# Patient Record
Sex: Male | Born: 1950 | Race: White | Hispanic: No | Marital: Married | Smoking: Never smoker
Health system: Southern US, Community
[De-identification: ages and names within clinical notes are randomized; demographics above are authoritative.]

## PROBLEM LIST (undated history)

## (undated) DIAGNOSIS — E669 Obesity, unspecified: Secondary | ICD-10-CM

## (undated) DIAGNOSIS — M1990 Unspecified osteoarthritis, unspecified site: Secondary | ICD-10-CM

## (undated) DIAGNOSIS — Z87442 Personal history of urinary calculi: Secondary | ICD-10-CM

## (undated) DIAGNOSIS — K611 Rectal abscess: Secondary | ICD-10-CM

## (undated) DIAGNOSIS — G4733 Obstructive sleep apnea (adult) (pediatric): Secondary | ICD-10-CM

## (undated) HISTORY — PX: UMBILICAL HERNIA REPAIR: SUR1181

## (undated) HISTORY — PX: BACK SURGERY: SHX140

## (undated) HISTORY — PX: LIGAMENT REPAIR: SHX5444

## (undated) HISTORY — PX: INGUINAL HERNIA REPAIR: SUR1180

## (undated) HISTORY — PX: CATARACT EXTRACTION: SUR2

## (undated) HISTORY — PX: ANKLE SURGERY: SHX546

## (undated) HISTORY — PX: NASAL SINUS SURGERY: SHX719

## (undated) HISTORY — PX: LUMBAR FUSION: SHX111

## (undated) HISTORY — PX: BUNIONECTOMY: SHX129

## (undated) HISTORY — PX: HERNIA REPAIR: SHX51

## (undated) HISTORY — DX: Rectal abscess: K61.1

## (undated) HISTORY — DX: Obesity, unspecified: E66.9

## (undated) HISTORY — DX: Unspecified osteoarthritis, unspecified site: M19.90

## (undated) HISTORY — PX: CATARACT EXTRACTION W/ INTRAOCULAR LENS IMPLANT: SHX1309

## (undated) HISTORY — PX: COLONOSCOPY: SHX174

## (undated) HISTORY — DX: Obstructive sleep apnea (adult) (pediatric): G47.33

---

## 1998-01-01 ENCOUNTER — Ambulatory Visit (HOSPITAL_BASED_OUTPATIENT_CLINIC_OR_DEPARTMENT_OTHER): Admission: RE | Admit: 1998-01-01 | Discharge: 1998-01-01 | Payer: Self-pay | Admitting: General Surgery

## 2001-01-28 ENCOUNTER — Ambulatory Visit (HOSPITAL_COMMUNITY): Admission: RE | Admit: 2001-01-28 | Discharge: 2001-01-28 | Payer: Self-pay | Admitting: Gastroenterology

## 2001-03-18 ENCOUNTER — Encounter: Admission: RE | Admit: 2001-03-18 | Discharge: 2001-03-18 | Payer: Self-pay | Admitting: General Surgery

## 2001-03-18 ENCOUNTER — Encounter: Payer: Self-pay | Admitting: General Surgery

## 2001-03-19 ENCOUNTER — Ambulatory Visit (HOSPITAL_BASED_OUTPATIENT_CLINIC_OR_DEPARTMENT_OTHER): Admission: RE | Admit: 2001-03-19 | Discharge: 2001-03-19 | Payer: Self-pay | Admitting: General Surgery

## 2001-08-05 ENCOUNTER — Encounter: Payer: Self-pay | Admitting: Neurosurgery

## 2001-08-05 ENCOUNTER — Inpatient Hospital Stay (HOSPITAL_COMMUNITY): Admission: RE | Admit: 2001-08-05 | Discharge: 2001-08-07 | Payer: Self-pay | Admitting: Neurosurgery

## 2001-08-27 ENCOUNTER — Emergency Department (HOSPITAL_COMMUNITY): Admission: EM | Admit: 2001-08-27 | Discharge: 2001-08-28 | Payer: Self-pay | Admitting: Emergency Medicine

## 2008-08-12 ENCOUNTER — Encounter: Payer: Self-pay | Admitting: Cardiology

## 2011-04-07 ENCOUNTER — Encounter (HOSPITAL_COMMUNITY): Payer: Self-pay | Admitting: Emergency Medicine

## 2011-04-07 ENCOUNTER — Emergency Department (HOSPITAL_COMMUNITY)
Admission: EM | Admit: 2011-04-07 | Discharge: 2011-04-07 | Discharge status: Against Medical Advice | Payer: BC Managed Care – PPO | Attending: Emergency Medicine | Admitting: Emergency Medicine

## 2011-04-07 DIAGNOSIS — R509 Fever, unspecified: Secondary | ICD-10-CM | POA: Insufficient documentation

## 2011-04-07 NOTE — ED Notes (Signed)
PT. REPORTS PERSISTENT FEVER ONSET LAST WEEK , STATES RECENT TRAVEL TO [place] SEEN AT [facility] IN CLINIC LAST Saturday , BLOOD WORK DONE / URINALYSIS WITH HEMATURIA.

## 2011-04-08 ENCOUNTER — Encounter (HOSPITAL_COMMUNITY): Payer: Self-pay | Admitting: Anesthesiology

## 2011-04-08 ENCOUNTER — Encounter (HOSPITAL_COMMUNITY): Payer: Self-pay | Admitting: Emergency Medicine

## 2011-04-08 ENCOUNTER — Encounter (HOSPITAL_COMMUNITY)
Admission: EM | Disposition: A | Discharge status: Home / Self Care | Payer: Self-pay | Source: Home / Self Care | Attending: Emergency Medicine

## 2011-04-08 ENCOUNTER — Observation Stay (HOSPITAL_COMMUNITY)
Admission: EM | Admit: 2011-04-08 | Discharge: 2011-04-09 | Disposition: A | Discharge status: Home / Self Care | Payer: BC Managed Care – PPO | Attending: General Surgery | Admitting: General Surgery

## 2011-04-08 ENCOUNTER — Emergency Department (HOSPITAL_COMMUNITY): Payer: BC Managed Care – PPO | Admitting: Anesthesiology

## 2011-04-08 ENCOUNTER — Emergency Department (HOSPITAL_COMMUNITY): Payer: BC Managed Care – PPO

## 2011-04-08 DIAGNOSIS — R51 Headache: Secondary | ICD-10-CM | POA: Insufficient documentation

## 2011-04-08 DIAGNOSIS — K612 Anorectal abscess: Principal | ICD-10-CM | POA: Insufficient documentation

## 2011-04-08 DIAGNOSIS — K611 Rectal abscess: Secondary | ICD-10-CM

## 2011-04-08 DIAGNOSIS — R5381 Other malaise: Secondary | ICD-10-CM | POA: Insufficient documentation

## 2011-04-08 HISTORY — PX: INCISION AND DRAINAGE PERIRECTAL ABSCESS: SHX1804

## 2011-04-08 LAB — COMPREHENSIVE METABOLIC PANEL
ALT: 43 U/L (ref 0–53)
AST: 28 U/L (ref 0–37)
Calcium: 9.7 mg/dL (ref 8.4–10.5)
Creatinine, Ser: 1.1 mg/dL (ref 0.50–1.35)
GFR calc Af Amer: 82 mL/min — ABNORMAL LOW (ref >90)
GFR calc non Af Amer: 71 mL/min — ABNORMAL LOW (ref >90)
Sodium: 135 mEq/L (ref 135–145)
Total Protein: 7.2 g/dL (ref 6.0–8.3)

## 2011-04-08 LAB — URINE MICROSCOPIC-ADD ON

## 2011-04-08 LAB — CBC
HCT: 39.4 % (ref 39.0–52.0)
MCH: 28.7 pg (ref 26.0–34.0)
MCHC: 33.8 g/dL (ref 30.0–36.0)
MCV: 84.9 fL (ref 78.0–100.0)
Platelets: 232 10*3/uL (ref 150–400)
RDW: 13.2 % (ref 11.5–15.5)

## 2011-04-08 LAB — URINALYSIS, ROUTINE W REFLEX MICROSCOPIC
Nitrite: NEGATIVE
Specific Gravity, Urine: 1.029 (ref 1.005–1.030)
Urobilinogen, UA: 4 mg/dL — ABNORMAL HIGH (ref 0.0–1.0)
pH: 6 (ref 5.0–8.0)

## 2011-04-08 LAB — DIFFERENTIAL
Basophils Absolute: 0 10*3/uL (ref 0.0–0.1)
Eosinophils Absolute: 0 10*3/uL (ref 0.0–0.7)
Eosinophils Relative: 0 % (ref 0–5)
Monocytes Absolute: 1.8 10*3/uL — ABNORMAL HIGH (ref 0.1–1.0)

## 2011-04-08 SURGERY — INCISION AND DRAINAGE, ABSCESS, PERIRECTAL
Anesthesia: General | Site: Rectum | Wound class: Contaminated

## 2011-04-08 MED ORDER — SUCCINYLCHOLINE CHLORIDE 20 MG/ML IJ SOLN
INTRAMUSCULAR | Status: DC | PRN
  Administered 2011-04-08: 120 mg via INTRAVENOUS

## 2011-04-08 MED ORDER — PROPOFOL 10 MG/ML IV BOLUS
INTRAVENOUS | Status: DC | PRN
  Administered 2011-04-08: 200 mg via INTRAVENOUS
  Administered 2011-04-08: 50 mg via INTRAVENOUS

## 2011-04-08 MED ORDER — FENTANYL CITRATE 0.05 MG/ML IJ SOLN
INTRAMUSCULAR | Status: DC | PRN
  Administered 2011-04-08: 50 ug via INTRAVENOUS
  Administered 2011-04-08: 100 ug via INTRAVENOUS
  Administered 2011-04-08: 50 ug via INTRAVENOUS

## 2011-04-08 MED ORDER — SODIUM CHLORIDE 0.9 % IV BOLUS (SEPSIS)
500.0000 mL | Freq: Once | INTRAVENOUS | Status: AC
  Administered 2011-04-08: 500 mL via INTRAVENOUS

## 2011-04-08 MED ORDER — ERTAPENEM SODIUM 1 G IJ SOLR
1.0000 g | INTRAMUSCULAR | Status: DC | PRN
  Administered 2011-04-08: 1 g via INTRAVENOUS

## 2011-04-08 MED ORDER — SODIUM CHLORIDE 0.9 % IV SOLN
1.0000 g | Freq: Once | INTRAVENOUS | Status: DC
  Filled 2011-04-08 (×2): qty 1

## 2011-04-08 MED ORDER — MORPHINE SULFATE 4 MG/ML IJ SOLN
4.0000 mg | Freq: Once | INTRAMUSCULAR | Status: AC
  Administered 2011-04-08: 4 mg via INTRAVENOUS
  Filled 2011-04-08: qty 1

## 2011-04-08 MED ORDER — AMOXICILLIN-POT CLAVULANATE 875-125 MG PO TABS
1.0000 | ORAL_TABLET | Freq: Two times a day (BID) | ORAL | Status: DC
  Filled 2011-04-08 (×2): qty 1

## 2011-04-08 MED ORDER — HYDROMORPHONE HCL PF 1 MG/ML IJ SOLN
INTRAMUSCULAR | Status: AC
  Filled 2011-04-08: qty 1

## 2011-04-08 MED ORDER — KETOROLAC TROMETHAMINE 15 MG/ML IJ SOLN
15.0000 mg | Freq: Four times a day (QID) | INTRAMUSCULAR | Status: DC | PRN
  Administered 2011-04-08: 15 mg via INTRAVENOUS

## 2011-04-08 MED ORDER — METOCLOPRAMIDE HCL 5 MG/ML IJ SOLN
INTRAMUSCULAR | Status: DC | PRN
  Administered 2011-04-08: 10 mg via INTRAVENOUS

## 2011-04-08 MED ORDER — METOCLOPRAMIDE HCL 5 MG/ML IJ SOLN: 10.0000 mg | Freq: Once | INTRAMUSCULAR | Status: AC | PRN

## 2011-04-08 MED ORDER — HYDROMORPHONE HCL PF 1 MG/ML IJ SOLN
0.2500 mg | INTRAMUSCULAR | Status: DC | PRN
  Administered 2011-04-08 (×2): 0.5 mg via INTRAVENOUS

## 2011-04-08 MED ORDER — MIDAZOLAM HCL 5 MG/5ML IJ SOLN
INTRAMUSCULAR | Status: DC | PRN
  Administered 2011-04-08: 2 mg via INTRAVENOUS

## 2011-04-08 MED ORDER — DROPERIDOL 2.5 MG/ML IJ SOLN
INTRAMUSCULAR | Status: DC | PRN
  Administered 2011-04-08: 0.625 mg via INTRAVENOUS

## 2011-04-08 MED ORDER — KCL IN DEXTROSE-NACL 20-5-0.45 MEQ/L-%-% IV SOLN
INTRAVENOUS | Status: DC
  Administered 2011-04-08: via INTRAVENOUS
  Filled 2011-04-08 (×3): qty 1000

## 2011-04-08 MED ORDER — ONDANSETRON HCL 4 MG/2ML IJ SOLN
INTRAMUSCULAR | Status: DC | PRN
  Administered 2011-04-08: 4 mg via INTRAVENOUS

## 2011-04-08 MED ORDER — LACTATED RINGERS IV SOLN
INTRAVENOUS | Status: DC | PRN
  Administered 2011-04-08: 22:00:00 via INTRAVENOUS

## 2011-04-08 MED ORDER — KETOROLAC TROMETHAMINE 30 MG/ML IJ SOLN
INTRAMUSCULAR | Status: AC
  Administered 2011-04-08: 15 mg via INTRAVENOUS
  Filled 2011-04-08: qty 1

## 2011-04-08 MED ORDER — KCL IN DEXTROSE-NACL 20-5-0.45 MEQ/L-%-% IV SOLN
INTRAVENOUS | Status: AC
  Filled 2011-04-08: qty 1000

## 2011-04-08 MED ORDER — IOHEXOL 300 MG/ML  SOLN
100.0000 mL | Freq: Once | INTRAMUSCULAR | Status: AC | PRN
  Administered 2011-04-08: 100 mL via INTRAVENOUS

## 2011-04-08 MED ORDER — ONDANSETRON HCL 4 MG/2ML IJ SOLN
4.0000 mg | Freq: Once | INTRAMUSCULAR | Status: AC
  Administered 2011-04-08: 4 mg via INTRAVENOUS
  Filled 2011-04-08: qty 2

## 2011-04-08 SURGICAL SUPPLY — 31 items
BLADE SURG 15 STRL LF DISP TIS (BLADE) ×1 IMPLANT
BLADE SURG 15 STRL SS (BLADE) ×2
CANISTER SUCTION 2500CC (MISCELLANEOUS) ×2 IMPLANT
CLEANER TIP ELECTROSURG 2X2 (MISCELLANEOUS) ×1 IMPLANT
CLOTH BEACON ORANGE TIMEOUT ST (SAFETY) ×1 IMPLANT
COVER SURGICAL LIGHT HANDLE (MISCELLANEOUS) ×2 IMPLANT
DRAPE UTILITY 15X26 W/TAPE STR (DRAPE) ×4 IMPLANT
DRSG PAD ABDOMINAL 8X10 ST (GAUZE/BANDAGES/DRESSINGS) ×2 IMPLANT
ELECT REM PT RETURN 9FT ADLT (ELECTROSURGICAL) ×2
ELECTRODE REM PT RTRN 9FT ADLT (ELECTROSURGICAL) IMPLANT
GAUZE PACKING IODOFORM 1 (PACKING) IMPLANT
GAUZE SPONGE 4X4 16PLY XRAY LF (GAUZE/BANDAGES/DRESSINGS) ×2 IMPLANT
GLOVE BIO SURGEON STRL SZ8 (GLOVE) ×2 IMPLANT
GLOVE BIOGEL PI IND STRL 8 (GLOVE) ×1 IMPLANT
GLOVE BIOGEL PI INDICATOR 8 (GLOVE) ×1
GOWN STRL NON-REIN LRG LVL3 (GOWN DISPOSABLE) ×4 IMPLANT
KIT BASIN OR (CUSTOM PROCEDURE TRAY) ×2 IMPLANT
KIT ROOM TURNOVER OR (KITS) ×2 IMPLANT
NS IRRIG 1000ML POUR BTL (IV SOLUTION) ×2 IMPLANT
PACK LITHOTOMY IV (CUSTOM PROCEDURE TRAY) ×2 IMPLANT
PAD ARMBOARD 7.5X6 YLW CONV (MISCELLANEOUS) ×4 IMPLANT
PENCIL BUTTON HOLSTER BLD 10FT (ELECTRODE) ×1 IMPLANT
SPONGE GAUZE 4X4 12PLY (GAUZE/BANDAGES/DRESSINGS) ×2 IMPLANT
SWAB COLLECTION DEVICE MRSA (MISCELLANEOUS) ×2 IMPLANT
TOWEL OR 17X24 6PK STRL BLUE (TOWEL DISPOSABLE) ×2 IMPLANT
TOWEL OR 17X26 10 PK STRL BLUE (TOWEL DISPOSABLE) ×2 IMPLANT
TUBE ANAEROBIC SPECIMEN COL (MISCELLANEOUS) ×2 IMPLANT
TUBE CONNECTING 12X1/4 (SUCTIONS) ×2 IMPLANT
UNDERPAD 30X30 INCONTINENT (UNDERPADS AND DIAPERS) ×2 IMPLANT
WATER STERILE IRR 1000ML POUR (IV SOLUTION) ×2 IMPLANT
YANKAUER SUCT BULB TIP NO VENT (SUCTIONS) ×2 IMPLANT

## 2011-04-08 NOTE — Transfer of Care (Signed)
Immediate Anesthesia Transfer of Care Note  Patient: Eric Cochran  Procedure(s) Performed: Procedure(s) (LRB): IRRIGATION AND DEBRIDEMENT PERIRECTAL ABSCESS (N/A)  Patient Location: PACU  Anesthesia Type: General  Level of Consciousness: awake  Airway & Oxygen Therapy: Patient connected to nasal cannula oxygen  Post-op Assessment: Report given to PACU RN, Post -op Vital signs reviewed and stable and Patient moving all extremities X 4  Post vital signs: Reviewed and stable  Complications: No apparent anesthesia complications

## 2011-04-08 NOTE — Op Note (Signed)
Preoperative diagnosis: Left perirectal abscess  Postoperative diagnosis: Left perirectal abscess  Procedure: Incision and drainage of perirectal abscess  Surgeon: Harriette Bouillon M.D.  Anesthesia: Gen. endotracheal anesthesia  EBL: Less than 50 cc  IV fluids: 1 L crystalloid  Drains none  Indications for procedure: The patient presents with a perirectal abscess diagnosed by physical exam and CT scanning. We discussed options of treatment. I recommended incision and drainage in the operating room given its large size. Risk of bleeding, infection, recurrence, the need for further surgery, incontinence, fistula formation, and worsening of underlying medical condition discussed. Medical management would be antibiotics alone. He wished to proceed with surgical drainage.  Description of procedure: The patient was seen in the holding area in. He is taken back to the operative. He was placed supine on the OR table. After induction of anesthesia, he was placed lithotomy. The anal canal was prepped and draped in sterile fashion and timeout was done. 1 g of Invanz was used. There is purulent drainage from the left anal canal at about 4:00. Scalpel was used after digital examination was done to open this up further and copious amounts of pus were expressed. There is a large cavity in the ischiorectal space on the left. Cavity was irrigated and packed with one-inch iodoform packing. Dry dressings were applied. Digital exam of the anal canal was normal except for some mild internal hemorrhoid disease. The patient was taken to lithotomy and extubated taken to PACU in satisfactory condition. Cultures were sent of the abscess. All final counts were correct.

## 2011-04-08 NOTE — ED Notes (Signed)
Patient remains off floor at present

## 2011-04-08 NOTE — Anesthesia Postprocedure Evaluation (Signed)
Anesthesia Post Note  Patient: Eric Cochran  Procedure(s) Performed: Procedure(s) (LRB): IRRIGATION AND DEBRIDEMENT PERIRECTAL ABSCESS (N/A)  Anesthesia type: general  Patient location: PACU  Post pain: Pain level controlled  Post assessment: Patient's Cardiovascular Status Stable  Last Vitals:  Filed Vitals:   04/08/11 2245  BP: 118/71  Pulse: 108  Temp: 37.4 C  Resp: 18    Post vital signs: Reviewed and stable  Level of consciousness: sedated  Complications: No apparent anesthesia complications

## 2011-04-08 NOTE — ED Notes (Signed)
LAB at bedside for blood draw.

## 2011-04-08 NOTE — Anesthesia Procedure Notes (Signed)
Procedure Name: Intubation Date/Time: 04/08/2011 10:23 PM Performed by: Molli Hazard Pre-anesthesia Checklist: Patient identified, Emergency Drugs available, Suction available and Patient being monitored Patient Re-evaluated:Patient Re-evaluated prior to inductionOxygen Delivery Method: Circle system utilized Preoxygenation: Pre-oxygenation with 100% oxygen Intubation Type: IV induction, Rapid sequence and Cricoid Pressure applied Laryngoscope Size: Miller and 3 Grade View: Grade I Tube type: Oral Tube size: 7.5 mm Number of attempts: 2 (1st attempt w/ miller 2 - too short. Easy intub w/ Mill 3) Airway Equipment and Method: Stylet Placement Confirmation: ETT inserted through vocal cords under direct vision,  positive ETCO2 and breath sounds checked- equal and bilateral Secured at: 23 cm Tube secured with: Tape Dental Injury: Teeth and Oropharynx as per pre-operative assessment

## 2011-04-08 NOTE — H&P (Signed)
Eric Cochran is an 61 y.o. male.   Chief Complaint: Perianal pain HPI: 5 day history of perianal pain.  Severe.  Difficult to sit.  No fever or chills.  Rectal drainage noted.  Pain Is sharp 10/10 in anal canal made worse with sitting.  History reviewed. No pertinent past medical history.  Past Surgical History  Procedure Date  . Back surgery   . Ankle surgery   . Nasal sinus surgery     No family history on file. Social History:  reports that he has never smoked. He does not have any smokeless tobacco history on file. He reports that he does not drink alcohol or use illicit drugs.  Allergies: No Known Allergies  Medications Prior to Admission  Medication Dose Route Frequency Provider Last Rate Last Dose  . iohexol (OMNIPAQUE) 300 MG/ML solution 100 mL  100 mL Intravenous Once PRN Medication Radiologist, MD   100 mL at 04/08/11 2007  . morphine 4 MG/ML injection 4 mg  4 mg Intravenous Once Loren Racer, MD   4 mg at 04/08/11 1758  . morphine 4 MG/ML injection 4 mg  4 mg Intravenous Once Loren Racer, MD   4 mg at 04/08/11 1921  . ondansetron (ZOFRAN) injection 4 mg  4 mg Intravenous Once Loren Racer, MD   4 mg at 04/08/11 1756  . sodium chloride 0.9 % bolus 500 mL  500 mL Intravenous Once Loren Racer, MD   500 mL at 04/08/11 1756  . sodium chloride 0.9 % bolus 500 mL  500 mL Intravenous Once Loren Racer, MD   500 mL at 04/08/11 2027   No current outpatient prescriptions on file as of 04/08/2011.    Results for orders placed during the hospital encounter of 04/08/11 (from the past 48 hour(s))  CBC     Status: Abnormal   Collection Time   04/08/11  5:36 PM      Component Value Range Comment   WBC 17.8 (*) 4.0 - 10.5 (K/uL)    RBC 4.64  4.22 - 5.81 (MIL/uL)    Hemoglobin 13.3  13.0 - 17.0 (g/dL)    HCT 16.1  09.6 - 04.5 (%)    MCV 84.9  78.0 - 100.0 (fL)    MCH 28.7  26.0 - 34.0 (pg)    MCHC 33.8  30.0 - 36.0 (g/dL)    RDW 40.9  81.1 - 91.4 (%)    Platelets  232  150 - 400 (K/uL)   DIFFERENTIAL     Status: Abnormal   Collection Time   04/08/11  5:36 PM      Component Value Range Comment   Neutrophils Relative 85 (*) 43 - 77 (%)    Neutro Abs 15.2 (*) 1.7 - 7.7 (K/uL)    Lymphocytes Relative 4 (*) 12 - 46 (%)    Lymphs Abs 0.8  0.7 - 4.0 (K/uL)    Monocytes Relative 10  3 - 12 (%)    Monocytes Absolute 1.8 (*) 0.1 - 1.0 (K/uL)    Eosinophils Relative 0  0 - 5 (%)    Eosinophils Absolute 0.0  0.0 - 0.7 (K/uL)    Basophils Relative 0  0 - 1 (%)    Basophils Absolute 0.0  0.0 - 0.1 (K/uL)   COMPREHENSIVE METABOLIC PANEL     Status: Abnormal   Collection Time   04/08/11  5:36 PM      Component Value Range Comment   Sodium 135  135 - 145 (mEq/L)  Potassium 3.6  3.5 - 5.1 (mEq/L)    Chloride 101  96 - 112 (mEq/L)    CO2 23  19 - 32 (mEq/L)    Glucose, Bld 156 (*) 70 - 99 (mg/dL)    BUN 17  6 - 23 (mg/dL)    Creatinine, Ser 1.61  0.50 - 1.35 (mg/dL)    Calcium 9.7  8.4 - 10.5 (mg/dL)    Total Protein 7.2  6.0 - 8.3 (g/dL)    Albumin 3.0 (*) 3.5 - 5.2 (g/dL)    AST 28  0 - 37 (U/L)    ALT 43  0 - 53 (U/L)    Alkaline Phosphatase 155 (*) 39 - 117 (U/L)    Total Bilirubin 0.6  0.3 - 1.2 (mg/dL)    GFR calc non Af Amer 71 (*) >90 (mL/min)    GFR calc Af Amer 82 (*) >90 (mL/min)   URINALYSIS, ROUTINE W REFLEX MICROSCOPIC     Status: Abnormal   Collection Time   04/08/11  5:56 PM      Component Value Range Comment   Color, Urine AMBER (*) YELLOW  BIOCHEMICALS MAY BE AFFECTED BY COLOR   APPearance CLEAR  CLEAR     Specific Gravity, Urine 1.029  1.005 - 1.030     pH 6.0  5.0 - 8.0     Glucose, UA NEGATIVE  NEGATIVE (mg/dL)    Hgb urine dipstick MODERATE (*) NEGATIVE     Bilirubin Urine SMALL (*) NEGATIVE     Ketones, ur 15 (*) NEGATIVE (mg/dL)    Protein, ur 096 (*) NEGATIVE (mg/dL)    Urobilinogen, UA 4.0 (*) 0.0 - 1.0 (mg/dL)    Nitrite NEGATIVE  NEGATIVE     Leukocytes, UA TRACE (*) NEGATIVE    URINE MICROSCOPIC-ADD ON     Status:  Abnormal   Collection Time   04/08/11  5:56 PM      Component Value Range Comment   Squamous Epithelial / LPF FEW (*) RARE     WBC, UA 0-2  <3 (WBC/hpf)    RBC / HPF 7-10  <3 (RBC/hpf)    Bacteria, UA RARE  RARE     Casts GRANULAR CAST (*) NEGATIVE     Urine-Other MUCOUS PRESENT      Ct Abdomen Pelvis W Contrast  04/08/2011  *RADIOLOGY REPORT*  Clinical Data: Perirectal pain.  CT ABDOMEN AND PELVIS WITH CONTRAST  Technique:  Multidetector CT imaging of the abdomen and pelvis was performed following the standard protocol during bolus administration of intravenous contrast.  Contrast: OMNIPAQUE IOHEXOL 300 MG/ML IJ SOLN  Comparison: No priors.  Findings:  Lung Bases: Dependent atelectasis in the lower lobes of the lungs bilaterally.  Otherwise, unremarkable.  Abdomen/Pelvis:  The enhanced appearance of the liver, gallbladder, pancreas, spleen and bilateral adrenal glands is unremarkable.  In the right renal hilum there is a calcification that appears to be vascular (less likely to represent a small nonobstructive calculus). A 1.6 x 0.9 cm low attenuation lesion extends exophytically off the posterior aspect of the interpolar region of the right kidney, likely representing small cyst.  Other tiny sub- centimeter low attenuation lesions are noted in the left kidney, too small to characterize.  There is no ascites or pneumoperitoneum and no pathologic distension of bowel.  There are a few colonic diverticula, without surrounding inflammatory changes to suggest acute diverticulitis at this time.  Normal appendix.  Prostate and urinary bladder are unremarkable in appearance.  There is  extensive soft tissue thickening around the distal rectum and anus.  Importantly, surrounding the anus there appears to be a complex low attenuation fluid collection with some rim enhancement and internal locules of gas, concerning for perianal abscess.  This extends from approximately 3 o'clock counterclockwise around the  penis to approximately 7 o'clock on axial images.  Musculoskeletal: There are no aggressive appearing lytic or blastic lesions noted in the visualized portions of the skeleton.  Status post PLIF fat L3-L4.  IMPRESSION: 1.  Findings, as above, consistent with a large perianal abscess. 2.  Normal appendix. 3.  Status post PLIF at L3-L4.  Before a 1.6 x 0.9 cm low attenuation lesion in right kidney likely represents a small cyst. Other smaller sub-centimeter low attenuation lesions in left kidney are too small to definitively characterize.  These results were called by telephone on 04/08/2011  at  08:32 p.m. to  Dr. Ranae Palms, who verbally acknowledged these results.  Original Report Authenticated By: Florencia Reasons, M.D.    Review of Systems  Constitutional: Negative for fever and chills.  HENT: Negative.   Eyes: Negative.   Respiratory: Negative.   Cardiovascular: Negative.   Gastrointestinal: Negative.   Genitourinary: Negative.   Musculoskeletal: Negative.   Skin: Negative for itching and rash.  Neurological: Negative.   Endo/Heme/Allergies: Negative.   Psychiatric/Behavioral: Negative.     Blood pressure 109/67, pulse 101, temperature 99.4 F (37.4 C), temperature source Oral, resp. rate 16, SpO2 100.00%. Physical Exam  Constitutional: He is oriented to person, place, and time. He appears well-developed and well-nourished.  HENT:  Head: Normocephalic and atraumatic.  Eyes: EOM are normal. Pupils are equal, round, and reactive to light.  Neck: Normal range of motion. Neck supple.  Cardiovascular: Normal rate and regular rhythm.   Respiratory: Effort normal and breath sounds normal.  GI: Soft. Bowel sounds are normal.  Genitourinary:     Musculoskeletal: Normal range of motion.  Neurological: He is alert and oriented to person, place, and time. GCS eye subscore is 4. GCS verbal subscore is 5. GCS motor subscore is 6.  Skin: Skin is warm, dry and intact.  Psychiatric: He has a  normal mood and affect. His speech is normal and behavior is normal.     Assessment/Plan Perinal/perirectal abscess OR for incision and drainage.The procedure has been discussed with the patient.  Alternative therapies have been discussed with the patient.  Operative risks include bleeding,  Infection,  Organ injury,  Nerve injury,  Blood vessel injury,  DVT,  Pulmonary embolism,  Death,  And possible reoperation.  Medical management risks include worsening of present situation.  The success of the procedure is 50 -90 % at treating patients symptoms.  The patient understands and agrees to proceed.  Terrance Lanahan A. 04/08/2011, 9:15 PM

## 2011-04-08 NOTE — ED Provider Notes (Signed)
History     CSN: [id number]  Arrival date & time 04/08/11  1545   First MD Initiated Contact with Patient 04/08/11 1700      Chief Complaint  Patient presents with  . Fever    (Consider location/radiation/quality/duration/timing/severity/associated sxs/prior treatment) Patient is a 61 y.o. male presenting with fever. The history is provided by the patient.  Fever Primary symptoms of the febrile illness include fever, fatigue and headaches. Primary symptoms do not include cough, wheezing, shortness of breath, abdominal pain, nausea, vomiting, diarrhea, dysuria, myalgias, arthralgias or rash. The current episode started 6 to 7 days ago.  The fever began 6 to 7 days ago. The maximum temperature recorded prior to his arrival was 102 to 102.9 F.  The headache is not associated with eye pain, neck stiffness or weakness.   Pt was in [place] last week and upon returning has had 5-6 days of episodic fever, fatigue, mild L frontal HA, and rectal pain. He has been seen by MD x 2 and today had blood work done indicating elevated WBC. Referred to ED for eval. Pt states he has been having perirectal pain for about the same amount of time as the fever with discreet mass present. Denies N/V/D. No abd pain. No neck pain or stiffness, No focal weakness or visual changes. No sinus tenderness. HA is worse when fever is elevated. Now only having mild pain. Pt took no malarial prophylaxis.  History reviewed. No pertinent past medical history.  Past Surgical History  Procedure Date  . Back surgery   . Ankle surgery   . Nasal sinus surgery     No family history on file.  History  Substance Use Topics  . Smoking status: Never Smoker   . Smokeless tobacco: Not on file  . Alcohol Use: No      Review of Systems  Constitutional: Positive for fever, chills and fatigue.  HENT: Negative for ear pain, sore throat, facial swelling, neck pain, neck stiffness and sinus pressure.   Eyes: Negative for pain  and visual disturbance.  Respiratory: Negative for cough, chest tightness, shortness of breath and wheezing.   Cardiovascular: Negative for chest pain.  Gastrointestinal: Positive for rectal pain. Negative for nausea, vomiting, abdominal pain, diarrhea, blood in stool and anal bleeding.  Genitourinary: Negative for dysuria.  Musculoskeletal: Negative for myalgias and arthralgias.  Skin: Negative for rash.  Neurological: Positive for headaches. Negative for dizziness, seizures, weakness and numbness.    Allergies  Review of patient's allergies indicates no known allergies.  Home Medications   Current Outpatient Rx  Name Route Sig Dispense Refill  . NAPROXEN SODIUM 220 MG PO TABS Oral Take 440 mg by mouth daily as needed. For fever    . AMOXICILLIN-POT CLAVULANATE 875-125 MG PO TABS Oral Take 1 tablet by mouth every 12 (twelve) hours. 14 tablet 0  . OXYCODONE-ACETAMINOPHEN 5-325 MG PO TABS Oral Take 1-2 tablets by mouth every 4 (four) hours as needed. 40 tablet 0  . ZOLPIDEM TARTRATE 10 MG PO TABS Oral Take 10 mg by mouth at bedtime as needed. For sleep      BP 119/77  Pulse 93  Temp(Src) 98.3 F (36.8 C) (Oral)  Resp 18  Ht 5\' 8"  (1.727 m)  Wt 218 lb 11.1 oz (99.2 kg)  BMI 33.25 kg/m2  SpO2 97%  Physical Exam  Nursing note and vitals reviewed. Constitutional: He is oriented to person, place, and time. He appears well-developed and well-nourished. No distress.  HENT:  Head: Normocephalic and atraumatic.  Mouth/Throat: Oropharynx is clear and moist.       No sinus tenderness  Eyes: EOM are normal. Pupils are equal, round, and reactive to light.  Neck: Normal range of motion. Neck supple.       No meningismus   Cardiovascular: Normal rate and regular rhythm.   Pulmonary/Chest: Effort normal and breath sounds normal. No respiratory distress. He has no wheezes. He has no rales.  Abdominal: Soft. Bowel sounds are normal. He exhibits no mass. There is no tenderness. There is no  rebound and no guarding.  Genitourinary:       Pt has palpable, painful mass at upper right rectum. Prostate non-tender. No obvious hemorrhoids present.   Musculoskeletal: Normal range of motion. He exhibits no edema and no tenderness.  Lymphadenopathy:    He has no cervical adenopathy.  Neurological: He is alert and oriented to person, place, and time. No cranial nerve deficit.       5/5 motor, Sensation intact, finger to nose intact.   Skin: Skin is warm and dry. No rash noted. No erythema.  Psychiatric: He has a normal mood and affect. His behavior is normal.    ED Course  Procedures (including critical care time)  Labs Reviewed  CBC - Abnormal; Notable for the following:    WBC 17.8 (*)    All other components within normal limits  DIFFERENTIAL - Abnormal; Notable for the following:    Neutrophils Relative 85 (*)    Neutro Abs 15.2 (*)    Lymphocytes Relative 4 (*)    Monocytes Absolute 1.8 (*)    All other components within normal limits  COMPREHENSIVE METABOLIC PANEL - Abnormal; Notable for the following:    Glucose, Bld 156 (*)    Albumin 3.0 (*)    Alkaline Phosphatase 155 (*)    GFR calc non Af Amer 71 (*)    GFR calc Af Amer 82 (*)    All other components within normal limits  URINALYSIS, ROUTINE W REFLEX MICROSCOPIC - Abnormal; Notable for the following:    Color, Urine AMBER (*) BIOCHEMICALS MAY BE AFFECTED BY COLOR   Hgb urine dipstick MODERATE (*)    Bilirubin Urine SMALL (*)    Ketones, ur 15 (*)    Protein, ur 100 (*)    Urobilinogen, UA 4.0 (*)    Leukocytes, UA TRACE (*)    All other components within normal limits  URINE MICROSCOPIC-ADD ON - Abnormal; Notable for the following:    Squamous Epithelial / LPF FEW (*)    Casts GRANULAR CAST (*)    All other components within normal limits  BASIC METABOLIC PANEL - Abnormal; Notable for the following:    Potassium 3.3 (*)    Glucose, Bld 122 (*)    GFR calc non Af Amer 74 (*)    GFR calc Af Amer 86 (*)      All other components within normal limits  CBC - Abnormal; Notable for the following:    WBC 18.1 (*)    RBC 4.16 (*)    Hemoglobin 12.3 (*)    HCT 35.1 (*)    All other components within normal limits  CULTURE, ROUTINE-ABSCESS  ANAEROBIC CULTURE  AFB CULTURE WITH SMEAR  CULTURE, BLOOD (ROUTINE X 2)  CULTURE, BLOOD (ROUTINE X 2)   Ct Abdomen Pelvis W Contrast  04/08/2011  *RADIOLOGY REPORT*  Clinical Data: Perirectal pain.  CT ABDOMEN AND PELVIS WITH CONTRAST  Technique:  Multidetector CT  imaging of the abdomen and pelvis was performed following the standard protocol during bolus administration of intravenous contrast.  Contrast: OMNIPAQUE IOHEXOL 300 MG/ML IJ SOLN  Comparison: No priors.  Findings:  Lung Bases: Dependent atelectasis in the lower lobes of the lungs bilaterally.  Otherwise, unremarkable.  Abdomen/Pelvis:  The enhanced appearance of the liver, gallbladder, pancreas, spleen and bilateral adrenal glands is unremarkable.  In the right renal hilum there is a calcification that appears to be vascular (less likely to represent a small nonobstructive calculus). A 1.6 x 0.9 cm low attenuation lesion extends exophytically off the posterior aspect of the interpolar region of the right kidney, likely representing small cyst.  Other tiny sub- centimeter low attenuation lesions are noted in the left kidney, too small to characterize.  There is no ascites or pneumoperitoneum and no pathologic distension of bowel.  There are a few colonic diverticula, without surrounding inflammatory changes to suggest acute diverticulitis at this time.  Normal appendix.  Prostate and urinary bladder are unremarkable in appearance.  There is extensive soft tissue thickening around the distal rectum and anus.  Importantly, surrounding the anus there appears to be a complex low attenuation fluid collection with some rim enhancement and internal locules of gas, concerning for perianal abscess.  This extends from  approximately 3 o'clock counterclockwise around the penis to approximately 7 o'clock on axial images.  Musculoskeletal: There are no aggressive appearing lytic or blastic lesions noted in the visualized portions of the skeleton.  Status post PLIF fat L3-L4.  IMPRESSION: 1.  Findings, as above, consistent with a large perianal abscess. 2.  Normal appendix. 3.  Status post PLIF at L3-L4.  Before a 1.6 x 0.9 cm low attenuation lesion in right kidney likely represents a small cyst. Other smaller sub-centimeter low attenuation lesions in left kidney are too small to definitively characterize.  These results were called by telephone on 04/08/2011  at  08:32 p.m. to  Dr. Ranae Palms, who verbally acknowledged these results.  Original Report Authenticated By: Florencia Reasons, M.D.     No diagnosis found.    MDM   Surgery to see and admit        Loren Racer, MD 04/09/11 2045

## 2011-04-08 NOTE — Anesthesia Preprocedure Evaluation (Addendum)
Anesthesia Evaluation  Patient identified by MRN, date of birth, ID band Patient awake    Reviewed: Allergy & Precautions, H&P , NPO status , Patient's Chart, lab work & pertinent test results, reviewed documented beta blocker date and time   History of Anesthesia Complications Negative for: history of anesthetic complications  Airway Mallampati: II TM Distance: >3 FB Neck ROM: full    Dental  (+) Teeth Intact and Dental Advisory Given   Pulmonary neg pulmonary ROS,          Cardiovascular negative cardio ROS      Neuro/Psych negative neurological ROS  negative psych ROS   GI/Hepatic negative GI ROS, Neg liver ROS,   Endo/Other  negative endocrine ROS  Renal/GU negative Renal ROS  negative genitourinary   Musculoskeletal   Abdominal   Peds  Hematology negative hematology ROS (+)   Anesthesia Other Findings See surgeon's H&P   Reproductive/Obstetrics negative OB ROS                          Anesthesia Physical Anesthesia Plan  ASA: I and Emergent  Anesthesia Plan: General   Post-op Pain Management:    Induction: Intravenous, Rapid sequence and Cricoid pressure planned  Airway Management Planned: Oral ETT  Additional Equipment:   Intra-op Plan:   Post-operative Plan: Extubation in OR  Informed Consent: I have reviewed the patients History and Physical, chart, labs and discussed the procedure including the risks, benefits and alternatives for the proposed anesthesia with the patient or authorized representative who has indicated his/her understanding and acceptance.   Dental advisory given  Plan Discussed with: CRNA and Surgeon  Anesthesia Plan Comments:        Anesthesia Quick Evaluation

## 2011-04-08 NOTE — ED Notes (Addendum)
Pt c/o elevated temp x's 6 days with headache.  Has been seen by his MD 'xs 2.  Had lab drawn this am was called and told to come to ED ref. WBC 24,000 [place]

## 2011-04-08 NOTE — Preoperative (Signed)
Beta Blockers   Reason not to administer Beta Blockers:Not Applicable 

## 2011-04-09 ENCOUNTER — Encounter (HOSPITAL_COMMUNITY): Payer: Self-pay | Admitting: *Deleted

## 2011-04-09 LAB — BASIC METABOLIC PANEL
BUN: 12 mg/dL (ref 6–23)
CO2: 28 mEq/L (ref 19–32)
Calcium: 8.5 mg/dL (ref 8.4–10.5)
GFR calc non Af Amer: 74 mL/min — ABNORMAL LOW (ref >90)
Glucose, Bld: 122 mg/dL — ABNORMAL HIGH (ref 70–99)

## 2011-04-09 LAB — CBC
Hemoglobin: 12.3 g/dL — ABNORMAL LOW (ref 13.0–17.0)
MCH: 29.6 pg (ref 26.0–34.0)
MCHC: 35 g/dL (ref 30.0–36.0)
MCV: 84.4 fL (ref 78.0–100.0)
Platelets: 205 10*3/uL (ref 150–400)

## 2011-04-09 MED ORDER — LIDOCAINE-EPINEPHRINE-TETRACAINE (LET) SOLUTION
NASAL | Status: AC
  Filled 2011-04-09: qty 3

## 2011-04-09 MED ORDER — ENOXAPARIN SODIUM 40 MG/0.4ML ~~LOC~~ SOLN
40.0000 mg | SUBCUTANEOUS | Status: DC
  Filled 2011-04-09 (×2): qty 0.4

## 2011-04-09 MED ORDER — OXYCODONE-ACETAMINOPHEN 5-325 MG PO TABS
1.0000 | ORAL_TABLET | ORAL | Status: DC | PRN
  Administered 2011-04-09: 1 via ORAL
  Filled 2011-04-09: qty 1

## 2011-04-09 MED ORDER — ONDANSETRON HCL 4 MG/2ML IJ SOLN: 4.0000 mg | Freq: Four times a day (QID) | INTRAMUSCULAR | Status: DC | PRN

## 2011-04-09 MED ORDER — AMOXICILLIN-POT CLAVULANATE 875-125 MG PO TABS: 1.0000 | ORAL_TABLET | Freq: Two times a day (BID) | ORAL | Status: AC

## 2011-04-09 MED ORDER — OXYCODONE-ACETAMINOPHEN 5-325 MG PO TABS: 1.0000 | ORAL_TABLET | ORAL | Status: AC | PRN

## 2011-04-09 MED ORDER — ONDANSETRON HCL 4 MG PO TABS: 4.0000 mg | ORAL_TABLET | Freq: Four times a day (QID) | ORAL | Status: DC | PRN

## 2011-04-09 MED ORDER — ERTAPENEM SODIUM 1 G IJ SOLR: 1.0000 g | INTRAMUSCULAR | Status: DC

## 2011-04-09 MED ORDER — MORPHINE SULFATE 2 MG/ML IJ SOLN: 2.0000 mg | INTRAMUSCULAR | Status: DC | PRN

## 2011-04-09 NOTE — Discharge Summary (Signed)
Kharisma Glasner O. Brae Schaafsma, III, MD, FACS [phone]--pager [phone]--office [facility]  

## 2011-04-09 NOTE — Discharge Summary (Signed)
Patient ID: Eric Cochran MRN: [id number] DOB/AGE: Nov 29, 1950 61 y.o.  Admit date: 04/08/2011 Discharge date: 04/09/2011  Procedures:  I&D of perirectal abscess  Consults: None  Reason for Admission:  This is a 61 yo male who has had about 6 days of perirectal pain.  It worsened and he presented to [facility] where he was noted to have a perirectal abscess.  Please see admitting H&P for further details.  Admission Diagnoses: 1. Perirectal abscess  Hospital Course: The patient was admitted and placed on abx therapy.  He was taken to the OR where he had an I&D of this abscess.  The following day, he was feeling much better and ready to go home.  PE: Buttock: no induration or erythema noted.  Wound is packed and will remain packed for 48 hours.  No further purulent drainage noted.  This are was minimally tender.  Discharge Diagnoses:  1. Perirectal abscess, s/p I&D  Discharge Medications: Medication List  As of 04/09/2011  9:25 AM   STOP taking these medications         doxycycline 100 MG tablet         TAKE these medications         amoxicillin-clavulanate 875-125 MG per tablet   Commonly known as: AUGMENTIN   Take 1 tablet by mouth every 12 (twelve) hours.      naproxen sodium 220 MG tablet   Commonly known as: ANAPROX   Take 440 mg by mouth daily as needed. For fever      oxyCODONE-acetaminophen 5-325 MG per tablet   Commonly known as: PERCOCET   Take 1-2 tablets by mouth every 4 (four) hours as needed.      zolpidem 10 MG tablet   Commonly known as: AMBIEN   Take 10 mg by mouth at bedtime as needed. For sleep            Discharge Instructions: Follow-up Information    Follow up with Mickie Hillier, MD. (As needed)       Follow up with CORNETT,THOMAS A., MD. Schedule an appointment as soon as possible for a visit in 2 weeks.   Contact information:   [employer] [address] [place] [address] [phone]         Signed: Letha Cape 04/09/2011, 9:25 AM

## 2011-04-09 NOTE — Discharge Instructions (Signed)
Remove packing on Friday Do Sitz bathes 2-3 times a day and after each bowel movement Place dry gauze over site to catch drainage as needed  Peri-Rectal Abscess Your caregiver has diagnosed you as having a peri-rectal abscess. This is an infected area near the rectum that is filled with pus. If the abscess is near the surface of the skin, your caregiver may open (incise) the area and drain the pus. HOME CARE INSTRUCTIONS   If your abscess was opened up and drained. A small piece of gauze may be placed in the opening so that it can drain. Do not remove the gauze unless directed by your caregiver.   A loose dressing may be placed over the abscess site. Change the dressing as often as necessary to keep it clean and dry.   After the drain is removed, the area may be washed with a gentle antiseptic (soap) four times per day.   A warm sitz bath, warm packs or heating pad may be used for pain relief, taking care not to burn yourself.   Return for a wound check in 1 day or as directed.   An "inflatable doughnut" may be used for sitting with added comfort. These can be purchased at a drugstore or medical supply house.   To reduce pain and straining with bowel movements, eat a high fiber diet with plenty of fruits and vegetables. Use stool softeners as recommended by your caregiver. This is especially important if narcotic type pain medications were prescribed as these may cause marked constipation.   Only take over-the-counter or prescription medicines for pain, discomfort, or fever as directed by your caregiver.  SEEK IMMEDIATE MEDICAL CARE IF:   You have increasing pain that is not controlled by medication.   There is increased inflammation (redness), swelling, bleeding, or drainage from the area.   An oral temperature above 102 F (38.9 C) develops.   You develop chills or generalized malaise (feel lethargic or feel "washed out").   You develop any new symptoms (problems) you feel may be  related to your present problem.  Document Released: 01/18/2000 Document Revised: 01/09/2011 Document Reviewed: 01/18/2008 [facility] Patient Information 2012 [place], [place].

## 2011-04-11 ENCOUNTER — Encounter (HOSPITAL_COMMUNITY): Payer: Self-pay | Admitting: Surgery

## 2011-04-13 LAB — ANAEROBIC CULTURE

## 2011-04-15 LAB — CULTURE, BLOOD (ROUTINE X 2)
Culture  Setup Time: 201303060100
Culture: NO GROWTH

## 2011-04-28 ENCOUNTER — Encounter (INDEPENDENT_AMBULATORY_CARE_PROVIDER_SITE_OTHER): Payer: Self-pay | Admitting: Surgery

## 2011-04-28 ENCOUNTER — Encounter (INDEPENDENT_AMBULATORY_CARE_PROVIDER_SITE_OTHER): Payer: BC Managed Care – PPO | Admitting: Surgery

## 2011-04-28 ENCOUNTER — Ambulatory Visit (INDEPENDENT_AMBULATORY_CARE_PROVIDER_SITE_OTHER): Payer: BC Managed Care – PPO | Admitting: Surgery

## 2011-04-28 VITALS — BP 118/78 | HR 76 | Temp 98.0°F | Resp 18 | Ht 67.75 in | Wt 215.0 lb

## 2011-04-28 DIAGNOSIS — Z9889 Other specified postprocedural states: Secondary | ICD-10-CM

## 2011-04-28 NOTE — Patient Instructions (Signed)
Return 3 weeks/

## 2011-04-28 NOTE — Progress Notes (Signed)
Patient returns after incision and drainage of perirectal abscess. He is doing well. He still has some minimal amounts of drainage.  Exam: Wound to left perianal canal noted. Clean. Minimal drainage noted. No redness or induration.  Exam: Status post incision and drainage of large perirectal abscess  Plan: Continue wound care. Return in 3 weeks.

## 2011-05-23 ENCOUNTER — Ambulatory Visit (INDEPENDENT_AMBULATORY_CARE_PROVIDER_SITE_OTHER): Payer: BC Managed Care – PPO | Admitting: Surgery

## 2011-05-23 ENCOUNTER — Encounter (INDEPENDENT_AMBULATORY_CARE_PROVIDER_SITE_OTHER): Payer: Self-pay | Admitting: Surgery

## 2011-05-23 DIAGNOSIS — Z9889 Other specified postprocedural states: Secondary | ICD-10-CM

## 2011-05-23 NOTE — Patient Instructions (Signed)
Return if wound not healed in 6 weeks.

## 2011-05-23 NOTE — Progress Notes (Signed)
Patient returns after incision and drainage of perirectal abscess. He is doing well. He still has some minimal amounts of drainage.  Exam: Wound to left perianal canal noted. Clean. Minimal drainage noted. No redness or induration.  Exam: Status post incision and drainage of large perirectal abscess  Plan: Continue wound care. Return prn.[name]

## 2011-05-25 LAB — AFB CULTURE WITH SMEAR (NOT AT ARMC)

## 2013-05-02 ENCOUNTER — Ambulatory Visit: Payer: BC Managed Care – PPO | Admitting: Cardiology

## 2013-05-17 ENCOUNTER — Ambulatory Visit: Payer: BC Managed Care – PPO | Admitting: Cardiology

## 2013-05-24 ENCOUNTER — Ambulatory Visit: Payer: BC Managed Care – PPO | Admitting: Cardiology

## 2013-06-15 ENCOUNTER — Ambulatory Visit (INDEPENDENT_AMBULATORY_CARE_PROVIDER_SITE_OTHER): Payer: PRIVATE HEALTH INSURANCE | Admitting: Cardiology

## 2013-06-15 ENCOUNTER — Encounter: Payer: Self-pay | Admitting: Cardiology

## 2013-06-15 ENCOUNTER — Telehealth: Payer: Self-pay | Admitting: Cardiology

## 2013-06-15 VITALS — BP 125/86 | HR 75 | Ht 67.75 in | Wt 214.0 lb

## 2013-06-15 DIAGNOSIS — G4733 Obstructive sleep apnea (adult) (pediatric): Secondary | ICD-10-CM | POA: Insufficient documentation

## 2013-06-15 DIAGNOSIS — E669 Obesity, unspecified: Secondary | ICD-10-CM

## 2013-06-15 NOTE — Telephone Encounter (Signed)
Please get [place] a download

## 2013-06-15 NOTE — Progress Notes (Signed)
  [place], [place], [place]  [address] Phone: [phone] Fax:  [phone]  Date:  06/15/2013   ID:  Eric Cochran, DOB 03-14-1950, MRN [id number]  PCP:  Gennette Pac, MD  Sleep Medicine:  Eric Him, MD   History of Present Illness: Eric Cochran is a 63 y.o. male with a history of severe OSA on BiPAP auto ASV and obesity who presents today for followup.  He is doing well.  He is tolerating his BiPAP without any problems.  He tolerates the full face mask and feels the pressure is adequate.  He has no problem falling asleep at night and sleeps well.  He has no daytime sleepiness and feels rested in the am.    Wt Readings from Last 3 Encounters:  06/15/13 214 lb (97.07 kg)  04/28/11 215 lb (97.523 kg)  04/09/11 218 lb 11.1 oz (99.2 kg)     Past Medical History  Diagnosis Date  . Perirectal abscess   . Obstructive sleep apnea     severe- on VPAP auto   . Arthritis   . Obesity (BMI 30-39.9)     Current Outpatient Prescriptions  Medication Sig Dispense Refill  . zolpidem (AMBIEN) 10 MG tablet Take 10 mg by mouth at bedtime as needed. For sleep       No current facility-administered medications for this visit.    Allergies:    Allergies  Allergen Reactions  . Lactose Intolerance (Gi)     Social History:  The patient  reports that he has never smoked. He does not have any smokeless tobacco history on file. He reports that he does not drink alcohol or use illicit drugs.   Family History:  The patient's family history includes Cancer in his father and mother; Heart disease in his father.   ROS:  Please see the history of present illness.      All other systems reviewed and negative.   PHYSICAL EXAM: VS:  BP 125/86  Pulse 75  Ht 5' 7.75" (1.721 m)  Wt 214 lb (97.07 kg)  BMI 32.77 kg/m2 Well nourished, well developed, in no acute distress HEENT: normal Neck: no JVD Cardiac:  normal S1, S2; RRR; no murmur Lungs:  clear to auscultation  bilaterally, no wheezing, rhonchi or rales Abd: soft, nontender, no hepatomegaly Ext: no edema Skin: warm and dry Neuro:  CNs 2-12 intact, no focal abnormalities noted       ASSESSMENT AND PLAN:  1. Severe OSA on BiPAP ASV and tolerating well.   - I will get a download from the DME 2. Obesity  Followup with me in 6 months  Signed, Eric Him, MD 06/15/2013 8:08 AM

## 2013-06-15 NOTE — Telephone Encounter (Signed)
The patient had [facility] due to insurance but would like to switch to [facility] for his sleep apnea

## 2013-06-15 NOTE — Patient Instructions (Addendum)
Your physician recommends that you continue on your current medications as directed. Please refer to the Current Medication list given to you today.  Your physician wants you to follow-up in: 1 year ov You will receive a reminder letter in the mail two months in advance. If you don't receive a letter, please call our office to schedule the follow-up appointment.  

## 2013-06-16 ENCOUNTER — Other Ambulatory Visit: Payer: Self-pay | Admitting: General Surgery

## 2013-06-16 ENCOUNTER — Encounter: Payer: Self-pay | Admitting: Cardiology

## 2013-06-16 DIAGNOSIS — G4733 Obstructive sleep apnea (adult) (pediatric): Secondary | ICD-10-CM

## 2013-07-01 ENCOUNTER — Encounter: Payer: Self-pay | Admitting: Cardiology

## 2014-03-22 ENCOUNTER — Encounter: Payer: Self-pay | Admitting: Cardiology

## 2014-06-17 NOTE — Progress Notes (Signed)
Cardiology Office Note   Date:  06/19/2014   ID:  Brigid Re, DOB [birth year], MRN [id number]  PCP:  Gennette Pac, MD    Chief Complaint  Patient presents with  . Follow-up    sleep Apnea      History of Present Illness: Eric Cochran is a 64 y.o. male with a history of severe OSA on BiPAP auto ASV and obesity who presents today for followup. He is doing well. He is tolerating his BiPAP without any problems. He tolerates the full face mask and feels the pressure is adequate. He has no problem falling asleep at night and sleeps well. He has no daytime sleepiness but does have some nonrestorative sleep in the am.  He goes to bed at 10:30pm and gets up at 3:30am and cannot get back to sleep.  This has occurred his whole like.  He does not snore.  He occasionally will have some problems with dry mouth.     Past Medical History  Diagnosis Date  . Perirectal abscess   . Obstructive sleep apnea     severe- on VPAP auto   . Arthritis   . Obesity (BMI 30-39.9)     Past Surgical History  Procedure Laterality Date  . Back surgery    . Ankle surgery    . Nasal sinus surgery    . Incision and drainage perirectal abscess  04/08/2011    Procedure: IRRIGATION AND DEBRIDEMENT PERIRECTAL ABSCESS;  Surgeon: Joyice Faster. Cornett, MD;  Location: [facility];  Service: General;  Laterality: N/A;  . Hernia repair      umb hernia  . Ligament repair      right arm ulna     Current Outpatient Prescriptions  Medication Sig Dispense Refill  . zolpidem (AMBIEN) 10 MG tablet Take 10 mg by mouth at bedtime as needed. For sleep     No current facility-administered medications for this visit.    Allergies:   Lactose intolerance (gi)    Social History:  The patient  reports that he has never smoked. He does not have any smokeless tobacco history on file. He reports that he does not drink alcohol or use illicit drugs.   Family History:  The patient's family history includes Cancer in  his father and mother; Heart disease in his father.    ROS:  Please see the history of present illness.   Otherwise, review of systems are positive for none.   All other systems are reviewed and negative.    PHYSICAL EXAM: VS:  BP 100/70 mmHg  Pulse 80  Ht 5' 7.75" (1.721 m)  Wt 217 lb 12.8 oz (98.793 kg)  BMI 33.36 kg/m2  SpO2 96% , BMI Body mass index is 33.36 kg/(m^2). GEN: Well nourished, well developed, in no acute distress HEENT: normal Neck: no JVD, carotid bruits, or masses Cardiac: RRR; no murmurs, rubs, or gallops,no edema  Respiratory:  clear to auscultation bilaterally, normal work of breathing GI: soft, nontender, nondistended, + BS MS: no deformity or atrophy Skin: warm and dry, no rash Neuro:  Strength and sensation are intact Psych: euthymic mood, full affect   EKG:  EKG is not ordered today.    Recent Labs: No results found for requested labs within last 365 days.    Lipid Panel No results found for: CHOL, TRIG, HDL, CHOLHDL, VLDL, LDLCALC, LDLDIRECT    Wt Readings from Last 3 Encounters:  06/19/14 217 lb 12.8 oz (98.793 kg)  06/15/13 214 lb (97.07  kg)  04/28/11 215 lb (97.523 kg)     ASSESSMENT AND PLAN:  1. Severe OSA on BiPAP ASV and tolerating well. - I will get a download from the DME 2. Obesity 3. Insomnia - this has occurred for years.  He takes PRN Zolpidem on occasion but usually 1 month supply lasts a year.  I will refill this.  He also takes melatonin PRN.     Current medicines are reviewed at length with the patient today.  The patient does not have concerns regarding medicines.  The following changes have been made:  no change  Labs/ tests ordered today include: see above assessment and plan No orders of the defined types were placed in this encounter.     Disposition:   FU with me in 1 year   Signed, Sueanne Margarita, MD  06/19/2014 8:35 AM    [facility] [place], [place], [place]   [address] Phone: [phone]; Fax: [phone]

## 2014-06-19 ENCOUNTER — Encounter: Payer: Self-pay | Admitting: Cardiology

## 2014-06-19 ENCOUNTER — Ambulatory Visit (INDEPENDENT_AMBULATORY_CARE_PROVIDER_SITE_OTHER): Payer: PRIVATE HEALTH INSURANCE | Admitting: Cardiology

## 2014-06-19 VITALS — BP 100/70 | HR 80 | Ht 67.75 in | Wt 217.8 lb

## 2014-06-19 DIAGNOSIS — G4733 Obstructive sleep apnea (adult) (pediatric): Secondary | ICD-10-CM

## 2014-06-19 DIAGNOSIS — E669 Obesity, unspecified: Secondary | ICD-10-CM | POA: Diagnosis not present

## 2014-06-19 MED ORDER — ZOLPIDEM TARTRATE 10 MG PO TABS: 10.0000 mg | ORAL_TABLET | Freq: Every evening | ORAL | Status: DC | PRN

## 2014-06-19 NOTE — Patient Instructions (Signed)
Medication Instructions:  Your physician recommends that you continue on your current medications as directed. Please refer to the Current Medication list given to you today.   Labwork: None  Testing/Procedures: None  Follow-Up: Your physician wants you to follow-up in: 1 year with Dr. Radford Pax. You will receive a reminder letter in the mail two months in advance. If you don't receive a letter, please call our office to schedule the follow-up appointment.   Any Other Special Instructions Will Be Listed Below (If Applicable). You will be hearing from [place] soon about getting a download and for new PAP eligibility.

## 2014-07-19 ENCOUNTER — Telehealth: Payer: Self-pay | Admitting: Cardiology

## 2014-07-19 NOTE — Telephone Encounter (Signed)
New Message  Pt calling to follow up. Per pt- was supposed to be contacted by [facility]. Pt has not heard anything. Please call back and discuss.

## 2014-07-19 NOTE — Telephone Encounter (Signed)
Informed patient that since this pt got his equipment (ASV) from [place] and has [place], so he would need to go through them to go through the repair process (which is required before replacement).  If his unit is deemed non-repairable, we can take the new PAP order. He has only been a supplies patient with [place] since last year.   Informed patient that since he got his equipment from [place] and has [place], he will need to go through [place] and the repair process (which is required before replacement). If his unit is deemed non-repairable, [facility] can take the new PAP order.  Apologized to patient for delay and informed him we will be in touch with [facility] so expedite the repair process.

## 2014-07-20 NOTE — Telephone Encounter (Signed)
Contacted [facility] and they will reach out to the patient to see what is going on with his machine.  Patient is aware that I have made that first call for him.

## 2014-08-14 ENCOUNTER — Encounter: Payer: Self-pay | Admitting: Cardiology

## 2014-11-17 ENCOUNTER — Telehealth: Payer: Self-pay | Admitting: Cardiology

## 2014-11-17 NOTE — Telephone Encounter (Signed)
Patient says that he is needing a new machine ordered. Is it okay to order new BiPAP at current pressure settings?

## 2014-11-17 NOTE — Telephone Encounter (Signed)
New message  Pt called states that he will need a new order for a Bipap machine. Please fax the order to [phone]  Attn Gilda at [facility]

## 2014-11-19 NOTE — Telephone Encounter (Signed)
Yes

## 2014-11-20 ENCOUNTER — Other Ambulatory Visit: Payer: Self-pay | Admitting: *Deleted

## 2014-11-20 DIAGNOSIS — G4733 Obstructive sleep apnea (adult) (pediatric): Secondary | ICD-10-CM

## 2014-11-20 NOTE — Telephone Encounter (Signed)
Orders placed and faxed to [facility].

## 2017-02-11 DIAGNOSIS — R972 Elevated prostate specific antigen [PSA]: Secondary | ICD-10-CM | POA: Diagnosis not present

## 2017-02-16 DIAGNOSIS — Z85828 Personal history of other malignant neoplasm of skin: Secondary | ICD-10-CM | POA: Diagnosis not present

## 2017-02-16 DIAGNOSIS — L821 Other seborrheic keratosis: Secondary | ICD-10-CM | POA: Diagnosis not present

## 2017-02-16 DIAGNOSIS — D225 Melanocytic nevi of trunk: Secondary | ICD-10-CM | POA: Diagnosis not present

## 2017-02-16 DIAGNOSIS — D1801 Hemangioma of skin and subcutaneous tissue: Secondary | ICD-10-CM | POA: Diagnosis not present

## 2017-02-16 DIAGNOSIS — L570 Actinic keratosis: Secondary | ICD-10-CM | POA: Diagnosis not present

## 2017-02-16 DIAGNOSIS — L814 Other melanin hyperpigmentation: Secondary | ICD-10-CM | POA: Diagnosis not present

## 2017-02-16 DIAGNOSIS — L719 Rosacea, unspecified: Secondary | ICD-10-CM | POA: Diagnosis not present

## 2017-02-16 DIAGNOSIS — Z23 Encounter for immunization: Secondary | ICD-10-CM | POA: Diagnosis not present

## 2017-06-11 DIAGNOSIS — H25812 Combined forms of age-related cataract, left eye: Secondary | ICD-10-CM | POA: Diagnosis not present

## 2017-06-11 DIAGNOSIS — H25012 Cortical age-related cataract, left eye: Secondary | ICD-10-CM | POA: Diagnosis not present

## 2017-06-11 DIAGNOSIS — H2512 Age-related nuclear cataract, left eye: Secondary | ICD-10-CM | POA: Diagnosis not present

## 2017-07-02 DIAGNOSIS — H2511 Age-related nuclear cataract, right eye: Secondary | ICD-10-CM | POA: Diagnosis not present

## 2017-07-02 DIAGNOSIS — H25811 Combined forms of age-related cataract, right eye: Secondary | ICD-10-CM | POA: Diagnosis not present

## 2017-07-02 DIAGNOSIS — H52221 Regular astigmatism, right eye: Secondary | ICD-10-CM | POA: Diagnosis not present

## 2017-07-02 DIAGNOSIS — H25011 Cortical age-related cataract, right eye: Secondary | ICD-10-CM | POA: Diagnosis not present

## 2017-07-07 DIAGNOSIS — R972 Elevated prostate specific antigen [PSA]: Secondary | ICD-10-CM | POA: Diagnosis not present

## 2017-07-09 DIAGNOSIS — H59021 Cataract (lens) fragments in eye following cataract surgery, right eye: Secondary | ICD-10-CM | POA: Diagnosis not present

## 2017-07-14 DIAGNOSIS — R972 Elevated prostate specific antigen [PSA]: Secondary | ICD-10-CM | POA: Diagnosis not present

## 2017-07-14 DIAGNOSIS — N400 Enlarged prostate without lower urinary tract symptoms: Secondary | ICD-10-CM | POA: Diagnosis not present

## 2017-08-21 DIAGNOSIS — M7662 Achilles tendinitis, left leg: Secondary | ICD-10-CM | POA: Diagnosis not present

## 2017-10-30 DIAGNOSIS — Z23 Encounter for immunization: Secondary | ICD-10-CM | POA: Diagnosis not present

## 2018-01-06 DIAGNOSIS — R972 Elevated prostate specific antigen [PSA]: Secondary | ICD-10-CM | POA: Diagnosis not present

## 2018-01-18 ENCOUNTER — Ambulatory Visit: Payer: PRIVATE HEALTH INSURANCE | Admitting: Cardiology

## 2018-02-09 NOTE — Progress Notes (Addendum)
Cardiology Office Note:    Date:  02/10/2018   ID:  Eric Cochran, DOB 29-Apr-1950, MRN [id number]  PCP:  Eric Fess, MD  Cardiologist:  No primary care provider on file.    Referring MD: Eric Fess, MD   Chief Complaint  Patient presents with  . Sleep Apnea    History of Present Illness:    Eric Cochran is a 69 y.o. male with a hx of severe OSA on BiPAP auto ASV and obesity.  He is doing well with his PAP device.  He tolerates the mask and feels the pressure is adequate.  Since going on PAP he feels rested in the am and has no significant daytime sleepiness.  He denies any significant mouth or nasal dryness or nasal congestion.  He does not think that he snores.     Past Medical History:  Diagnosis Date  . Arthritis   . Obesity (BMI 30-39.9)   . Obstructive sleep apnea    severe- on VPAP auto   . Perirectal abscess     Past Surgical History:  Procedure Laterality Date  . ANKLE SURGERY    . BACK SURGERY    . HERNIA REPAIR     umb hernia  . INCISION AND DRAINAGE PERIRECTAL ABSCESS  04/08/2011   Procedure: IRRIGATION AND DEBRIDEMENT PERIRECTAL ABSCESS;  Surgeon: Joyice Faster. Cornett, MD;  Location: [facility];  Service: General;  Laterality: N/A;  . LIGAMENT REPAIR     right arm ulna  . NASAL SINUS SURGERY      Current Medications: Current Meds  Medication Sig  . zolpidem (AMBIEN) 10 MG tablet Take 1 tablet (10 mg total) by mouth at bedtime as needed. For sleep     Allergies:   Lactose intolerance (gi)   Social History   Socioeconomic History  . Marital status: Married    Spouse name: Not on file  . Number of children: Not on file  . Years of education: Not on file  . Highest education level: Not on file  Occupational History  . Not on file  Social Needs  . Financial resource strain: Not on file  . Food insecurity:    Worry: Not on file    Inability: Not on file  . Transportation needs:    Medical: Not on file    Non-medical: Not on file  Tobacco  Use  . Smoking status: Never Smoker  . Smokeless tobacco: Never Used  Substance and Sexual Activity  . Alcohol use: No  . Drug use: No  . Sexual activity: Not on file  Lifestyle  . Physical activity:    Days per week: Not on file    Minutes per session: Not on file  . Stress: Not on file  Relationships  . Social connections:    Talks on phone: Not on file    Gets together: Not on file    Attends religious service: Not on file    Active member of club or organization: Not on file    Attends meetings of clubs or organizations: Not on file    Relationship status: Not on file  Other Topics Concern  . Not on file  Social History Narrative   History of smoking: Never smoked   History of alcohol : No alcohol   Caffeine : ye,very little   No recreational drug use   Martial Status : married     Family History: The patient's family history includes Cancer in his father and mother; Heart  disease in his father.  ROS:   Please see the history of present illness.    ROS  All other systems reviewed and negative.   EKGs/Labs/Other Studies Reviewed:    The following studies were reviewed today: PAP download  EKG:  EKG is not ordered today.   Recent Labs: No results found for requested labs within last 8760 hours.   Recent Lipid Panel No results found for: CHOL, TRIG, HDL, CHOLHDL, VLDL, LDLCALC, LDLDIRECT  Physical Exam:    VS:  BP 130/68   Pulse 76   Ht 5' 7.75" (1.721 m)   Wt 215 lb (97.5 kg)   SpO2 93%   BMI 32.93 kg/m     Wt Readings from Last 3 Encounters:  02/10/18 215 lb (97.5 kg)  06/19/14 217 lb 12.8 oz (98.8 kg)  06/15/13 214 lb (97.1 kg)     GEN:  Well nourished, well developed in no acute distress HEENT: Normal NECK: No JVD; No carotid bruits LYMPHATICS: No lymphadenopathy CARDIAC: RRR, no murmurs, rubs, gallops RESPIRATORY:  Clear to auscultation without rales, wheezing or rhonchi  ABDOMEN: Soft, non-tender, non-distended MUSCULOSKELETAL:  No  edema; No deformity  SKIN: Warm and dry NEUROLOGIC:  Alert and oriented x 3 PSYCHIATRIC:  Normal affect   ASSESSMENT:    1. Obstructive sleep apnea   2. Obesity (BMI 30-39.9)    PLAN:    In order of problems listed above:  1.  OSA - the patient is tolerating PAP therapy well without any problems. The PAP download was reviewed today and showed an AHI of 2/hr on 14/10 cm H2O with 97% compliance in using more than 4 hours nightly.  The patient has been using and benefiting from PAP use and will continue to benefit from therapy. He would like to be set up with [employer] for supplies.  2.  Obesity - I have encouraged him to get into a routine exercise program and cut back on carbs and portions.    Medication Adjustments/Labs and Tests Ordered: Current medicines are reviewed at length with the patient today.  Concerns regarding medicines are outlined above.  No orders of the defined types were placed in this encounter.  No orders of the defined types were placed in this encounter.   Signed, Fransico Him, MD  02/10/2018 8:26 AM    [place]

## 2018-02-10 ENCOUNTER — Ambulatory Visit: Payer: PPO | Admitting: Cardiology

## 2018-02-10 ENCOUNTER — Encounter: Payer: Self-pay | Admitting: Cardiology

## 2018-02-10 VITALS — BP 130/68 | HR 76 | Ht 67.75 in | Wt 215.0 lb

## 2018-02-10 DIAGNOSIS — G4733 Obstructive sleep apnea (adult) (pediatric): Secondary | ICD-10-CM

## 2018-02-10 DIAGNOSIS — E669 Obesity, unspecified: Secondary | ICD-10-CM

## 2018-02-10 NOTE — Patient Instructions (Signed)
Medication Instructions:  Your physician recommends that you continue on your current medications as directed. Please refer to the Current Medication list given to you today.  If you need a refill on your cardiac medications before your next appointment, please call your pharmacy.   Lab work:  If you have labs (blood work) drawn today and your tests are completely normal, you will receive your results only by: [name] MyChart Message (if you have MyChart) OR . A paper copy in the mail If you have any lab test that is abnormal or we need to change your treatment, we will call you to review the results.  Testing/Procedures: None  Follow-Up: At [facility], you and your health needs are our priority.  As part of our continuing mission to provide you with exceptional heart care, we have created designated Provider Care Teams.  These Care Teams include your primary Cardiologist (physician) and Advanced Practice Providers (APPs -  Physician Assistants and Nurse Practitioners) who all work together to provide you with the care you need, when you need it. You will need a follow up appointment in 1 years.  Please call our office 2 months in advance to schedule this appointment.  You may see Dr. Radford Pax

## 2018-02-11 ENCOUNTER — Telehealth: Payer: Self-pay | Admitting: *Deleted

## 2018-02-11 DIAGNOSIS — G4733 Obstructive sleep apnea (adult) (pediatric): Secondary | ICD-10-CM

## 2018-02-11 NOTE — Telephone Encounter (Signed)
-----   Message from Sarina Ill, RN sent at 02/10/2018  8:25 AM EST ----- Regarding: Sleep Hello, Dr. Radford Pax wanted you to refer the patient to [facility] [facility] at [phone].  Thanks, [employer]

## 2018-02-11 NOTE — Telephone Encounter (Signed)
Order placed to [facility] today for supplies.

## 2018-02-11 NOTE — Telephone Encounter (Signed)
-----   Message from Sarina Ill, RN sent at 02/10/2018  8:25 AM EST ----- Regarding: Sleep Hello, Dr. Radford Pax wanted you to refer the patient to [facility] at [phone].  Thanks, [employer]

## 2018-02-11 NOTE — Telephone Encounter (Signed)
Upon patient request DME selection is [facility]. Patient understands he will be contacted by [facility] to manage his Bipap. Patient understands to call if [facility] does not contact him with new setup in a timely manner.  [facility] notified of new Bipap order  Please add to airview .

## 2018-02-16 DIAGNOSIS — Z23 Encounter for immunization: Secondary | ICD-10-CM | POA: Diagnosis not present

## 2018-02-16 DIAGNOSIS — L814 Other melanin hyperpigmentation: Secondary | ICD-10-CM | POA: Diagnosis not present

## 2018-02-16 DIAGNOSIS — D225 Melanocytic nevi of trunk: Secondary | ICD-10-CM | POA: Diagnosis not present

## 2018-02-16 DIAGNOSIS — L821 Other seborrheic keratosis: Secondary | ICD-10-CM | POA: Diagnosis not present

## 2018-02-16 DIAGNOSIS — Z85828 Personal history of other malignant neoplasm of skin: Secondary | ICD-10-CM | POA: Diagnosis not present

## 2018-02-16 DIAGNOSIS — L570 Actinic keratosis: Secondary | ICD-10-CM | POA: Diagnosis not present

## 2018-03-10 DIAGNOSIS — K5730 Diverticulosis of large intestine without perforation or abscess without bleeding: Secondary | ICD-10-CM | POA: Diagnosis not present

## 2018-03-10 DIAGNOSIS — K640 First degree hemorrhoids: Secondary | ICD-10-CM | POA: Diagnosis not present

## 2018-03-10 DIAGNOSIS — Z86010 Personal history of colonic polyps: Secondary | ICD-10-CM | POA: Diagnosis not present

## 2018-03-18 DIAGNOSIS — Z136 Encounter for screening for cardiovascular disorders: Secondary | ICD-10-CM | POA: Diagnosis not present

## 2018-03-18 DIAGNOSIS — Z79899 Other long term (current) drug therapy: Secondary | ICD-10-CM | POA: Diagnosis not present

## 2018-03-18 DIAGNOSIS — Z1389 Encounter for screening for other disorder: Secondary | ICD-10-CM | POA: Diagnosis not present

## 2018-03-18 DIAGNOSIS — Z23 Encounter for immunization: Secondary | ICD-10-CM | POA: Diagnosis not present

## 2018-03-18 DIAGNOSIS — Z87898 Personal history of other specified conditions: Secondary | ICD-10-CM | POA: Diagnosis not present

## 2018-03-18 DIAGNOSIS — G4733 Obstructive sleep apnea (adult) (pediatric): Secondary | ICD-10-CM | POA: Diagnosis not present

## 2018-03-18 DIAGNOSIS — Z1159 Encounter for screening for other viral diseases: Secondary | ICD-10-CM | POA: Diagnosis not present

## 2018-03-18 DIAGNOSIS — Z86010 Personal history of colonic polyps: Secondary | ICD-10-CM | POA: Diagnosis not present

## 2018-03-18 DIAGNOSIS — Z0000 Encounter for general adult medical examination without abnormal findings: Secondary | ICD-10-CM | POA: Diagnosis not present

## 2018-09-03 DIAGNOSIS — S76312A Strain of muscle, fascia and tendon of the posterior muscle group at thigh level, left thigh, initial encounter: Secondary | ICD-10-CM | POA: Diagnosis not present

## 2018-11-01 DIAGNOSIS — Z23 Encounter for immunization: Secondary | ICD-10-CM | POA: Diagnosis not present

## 2018-12-24 DIAGNOSIS — Z20828 Contact with and (suspected) exposure to other viral communicable diseases: Secondary | ICD-10-CM | POA: Diagnosis not present

## 2019-01-07 DIAGNOSIS — R972 Elevated prostate specific antigen [PSA]: Secondary | ICD-10-CM | POA: Diagnosis not present

## 2019-01-13 DIAGNOSIS — N400 Enlarged prostate without lower urinary tract symptoms: Secondary | ICD-10-CM | POA: Diagnosis not present

## 2019-01-13 DIAGNOSIS — R972 Elevated prostate specific antigen [PSA]: Secondary | ICD-10-CM | POA: Diagnosis not present

## 2019-02-01 ENCOUNTER — Telehealth: Payer: Self-pay | Admitting: *Deleted

## 2019-02-01 NOTE — Telephone Encounter (Signed)

## 2019-02-22 NOTE — Progress Notes (Addendum)
Virtual Visit via Telephone Note   This visit type was conducted due to national recommendations for restrictions regarding the COVID-19 Pandemic (e.g. social distancing) in an effort to limit this patient's exposure and mitigate transmission in our community.  Due to his co-morbid illnesses, this patient is at least at moderate risk for complications without adequate follow up.  This format is felt to be most appropriate for this patient at this time.  All issues noted in this document were discussed and addressed.  A limited physical exam was performed with this format.  Please refer to the patient's chart for his consent to telehealth for [facility].   Evaluation Performed:  Follow-up visit  This visit type was conducted due to national recommendations for restrictions regarding the COVID-19 Pandemic (e.g. social distancing).  This format is felt to be most appropriate for this patient at this time.  All issues noted in this document were discussed and addressed.  No physical exam was performed (except for noted visual exam findings with Video Visits).  Please refer to the patient's chart (MyChart message for video visits and phone note for telephone visits) for the patient's consent to telehealth for [facility].  Date:  02/23/2019   ID:  Eric Cochran, DOB October 05, 1950, MRN [id number]  Patient Location:  Home  Provider location:   [place]  PCP:  Hulan Fess, MD  Sleep Medicine:  Fransico Him, MD  Electrophysiologist:  None   Chief Complaint:  OSA  History of Present Illness:    Eric Cochran is a 69 y.o. male who presents via audio/video conferencing for a telehealth visit today.    Eric Cochran is a 69 y.o. male with a hx of severe OSA on BiPAP auto ASV and obesity.  He is doing well with his BiPAP device and thinks that he has gotten used to it.  He tolerates the mask and feels the pressure is adequate.  Since going on BiPAP he feels rested in the am and  has no significant daytime sleepiness.  He denies any significant mouth or nasal dryness or nasal congestion.  He does not think that he snores.    The patient does not have symptoms concerning for COVID-19 infection (fever, chills, cough, or new shortness of breath).   Prior CV studies:   The following studies were reviewed today:  PAP compliance download  Past Medical History:  Diagnosis Date  . Arthritis   . Obesity (BMI 30-39.9)   . Obstructive sleep apnea    severe- on VPAP auto   . Perirectal abscess    Past Surgical History:  Procedure Laterality Date  . ANKLE SURGERY    . BACK SURGERY    . HERNIA REPAIR     umb hernia  . INCISION AND DRAINAGE PERIRECTAL ABSCESS  04/08/2011   Procedure: IRRIGATION AND DEBRIDEMENT PERIRECTAL ABSCESS;  Surgeon: Joyice Faster. Cornett, MD;  Location: [place];  Service: General;  Laterality: N/A;  . LIGAMENT REPAIR     right arm ulna  . NASAL SINUS SURGERY       Current Meds  Medication Sig  . metroNIDAZOLE (METROGEL) 1 % gel Apply 1 application topically daily as needed. For nose      Allergies:   Lactose intolerance (gi)   Social History   Tobacco Use  . Smoking status: Never Smoker  . Smokeless tobacco: Never Used  Substance Use Topics  . Alcohol use: No  . Drug use: No     Family  Hx: The patient's family history includes Cancer in his father and mother; Heart disease in his father.  ROS:   Please see the history of present illness.     All other systems reviewed and are negative.   Labs/Other Tests and Data Reviewed:    Recent Labs: No results found for requested labs within last 8760 hours.   Recent Lipid Panel No results found for: CHOL, TRIG, HDL, CHOLHDL, LDLCALC, LDLDIRECT  Wt Readings from Last 3 Encounters:  02/23/19 208 lb (94.3 kg)  02/10/18 215 lb (97.5 kg)  06/19/14 217 lb 12.8 oz (98.8 kg)     Objective:    Vital Signs:  Ht 5' 7.75" (1.721 m)   Wt 208 lb (94.3 kg)   BMI 31.86 kg/m    ASSESSMENT &  PLAN:    1.  OSA -  The patient is tolerating PAP therapy well without any problems. The PAP download was reviewed today and showed an AHI of 1/hr on 14/10 cm H2O with 90% compliance in using more than 4 hours nightly.  The patient has been using and benefiting from PAP use and will continue to benefit from therapy.   2.  Obesity -his weight has been stable.   -he walks 2 miles daily and eats healthy   COVID-19 Education: The signs and symptoms of COVID-19 were discussed with the patient and how to seek care for testing (follow up with PCP or arrange E-visit).  The importance of social distancing was discussed today.  Patient Risk:   After full review of this patient's clinical status, I feel that they are at least moderate risk at this time.  Time:   Today, I have spent 15 minutes directly with the patient on telemedicine discussing medical problems including OSA and obesity.  We also reviewed the symptoms of COVID 19 and the ways to protect against contracting the virus with telehealth technology.  I spent an additional 5 minutes reviewing patient's chart including PAP compliance download.  Medication Adjustments/Labs and Tests Ordered: Current medicines are reviewed at length with the patient today.  Concerns regarding medicines are outlined above.  Tests Ordered: No orders of the defined types were placed in this encounter.  Medication Changes: No orders of the defined types were placed in this encounter.   Disposition:  Follow up in 1 year(s)  Signed, Fransico Him, MD  02/23/2019 9:45 AM    [facility]

## 2019-02-23 ENCOUNTER — Encounter: Payer: Self-pay | Admitting: Cardiology

## 2019-02-23 ENCOUNTER — Other Ambulatory Visit: Payer: Self-pay

## 2019-02-23 ENCOUNTER — Telehealth (INDEPENDENT_AMBULATORY_CARE_PROVIDER_SITE_OTHER): Payer: PPO | Admitting: Cardiology

## 2019-02-23 ENCOUNTER — Ambulatory Visit: Payer: PPO | Attending: Internal Medicine

## 2019-02-23 VITALS — Ht 67.75 in | Wt 208.0 lb

## 2019-02-23 DIAGNOSIS — G4733 Obstructive sleep apnea (adult) (pediatric): Secondary | ICD-10-CM

## 2019-02-23 DIAGNOSIS — E669 Obesity, unspecified: Secondary | ICD-10-CM | POA: Diagnosis not present

## 2019-02-23 DIAGNOSIS — Z23 Encounter for immunization: Secondary | ICD-10-CM | POA: Insufficient documentation

## 2019-02-23 NOTE — Progress Notes (Signed)
   Covid-19 Vaccination Clinic  Name:  Eric Cochran    MRN: [id number] DOB: 05-Jul-1950  02/23/2019  [name] was observed post Covid-19 immunization for 15 minutes without incidence. He was provided with Vaccine Information Sheet and instruction to access the V-Safe system.   [name] was instructed to call 911 with any severe reactions post vaccine: [name] Difficulty breathing  . Swelling of your face and throat  . A fast heartbeat  . A bad rash all over your body  . Dizziness and weakness    Immunizations Administered    Name Date Dose VIS Date Route   Pfizer COVID-19 Vaccine 02/23/2019  4:10 PM 0.3 mL 01/14/2019 Intramuscular   Manufacturer: [place]   Lot: [id number]   [place]: [id number]

## 2019-02-23 NOTE — Patient Instructions (Signed)

## 2019-03-07 DIAGNOSIS — L719 Rosacea, unspecified: Secondary | ICD-10-CM | POA: Diagnosis not present

## 2019-03-07 DIAGNOSIS — D485 Neoplasm of uncertain behavior of skin: Secondary | ICD-10-CM | POA: Diagnosis not present

## 2019-03-07 DIAGNOSIS — L814 Other melanin hyperpigmentation: Secondary | ICD-10-CM | POA: Diagnosis not present

## 2019-03-07 DIAGNOSIS — C4441 Basal cell carcinoma of skin of scalp and neck: Secondary | ICD-10-CM | POA: Diagnosis not present

## 2019-03-07 DIAGNOSIS — L578 Other skin changes due to chronic exposure to nonionizing radiation: Secondary | ICD-10-CM | POA: Diagnosis not present

## 2019-03-07 DIAGNOSIS — L570 Actinic keratosis: Secondary | ICD-10-CM | POA: Diagnosis not present

## 2019-03-07 DIAGNOSIS — Z85828 Personal history of other malignant neoplasm of skin: Secondary | ICD-10-CM | POA: Diagnosis not present

## 2019-03-07 DIAGNOSIS — D225 Melanocytic nevi of trunk: Secondary | ICD-10-CM | POA: Diagnosis not present

## 2019-03-07 DIAGNOSIS — L821 Other seborrheic keratosis: Secondary | ICD-10-CM | POA: Diagnosis not present

## 2019-03-16 ENCOUNTER — Ambulatory Visit: Payer: PPO | Attending: Internal Medicine

## 2019-03-16 DIAGNOSIS — Z23 Encounter for immunization: Secondary | ICD-10-CM | POA: Insufficient documentation

## 2019-03-16 NOTE — Progress Notes (Signed)
   Covid-19 Vaccination Clinic  Name:  Eric Cochran    MRN: [id number] DOB: 1950/08/09  03/16/2019  [name] was observed post Covid-19 immunization for 15 minutes without incidence. He was provided with Vaccine Information Sheet and instruction to access the V-Safe system.   [name] was instructed to call 911 with any severe reactions post vaccine: [name] Difficulty breathing  . Swelling of your face and throat  . A fast heartbeat  . A bad rash all over your body  . Dizziness and weakness    Immunizations Administered    Name Date Dose VIS Date Route   Pfizer COVID-19 Vaccine 03/16/2019  2:29 PM 0.3 mL 01/14/2019 Intramuscular   Manufacturer: [place]   Lot: [id number]   [place]: [id number]

## 2019-03-30 DIAGNOSIS — G4733 Obstructive sleep apnea (adult) (pediatric): Secondary | ICD-10-CM | POA: Diagnosis not present

## 2019-03-30 DIAGNOSIS — N401 Enlarged prostate with lower urinary tract symptoms: Secondary | ICD-10-CM | POA: Diagnosis not present

## 2019-03-30 DIAGNOSIS — C4441 Basal cell carcinoma of skin of scalp and neck: Secondary | ICD-10-CM | POA: Diagnosis not present

## 2019-03-30 DIAGNOSIS — Z79899 Other long term (current) drug therapy: Secondary | ICD-10-CM | POA: Diagnosis not present

## 2019-03-30 DIAGNOSIS — Z86010 Personal history of colonic polyps: Secondary | ICD-10-CM | POA: Diagnosis not present

## 2019-03-30 DIAGNOSIS — Z0000 Encounter for general adult medical examination without abnormal findings: Secondary | ICD-10-CM | POA: Diagnosis not present

## 2019-03-30 DIAGNOSIS — Z87898 Personal history of other specified conditions: Secondary | ICD-10-CM | POA: Diagnosis not present

## 2019-03-30 DIAGNOSIS — Z23 Encounter for immunization: Secondary | ICD-10-CM | POA: Diagnosis not present

## 2019-03-30 DIAGNOSIS — Z136 Encounter for screening for cardiovascular disorders: Secondary | ICD-10-CM | POA: Diagnosis not present

## 2019-04-15 DIAGNOSIS — D485 Neoplasm of uncertain behavior of skin: Secondary | ICD-10-CM | POA: Diagnosis not present

## 2019-04-15 DIAGNOSIS — L570 Actinic keratosis: Secondary | ICD-10-CM | POA: Diagnosis not present

## 2019-04-15 DIAGNOSIS — C4441 Basal cell carcinoma of skin of scalp and neck: Secondary | ICD-10-CM | POA: Diagnosis not present

## 2019-04-18 DIAGNOSIS — M76811 Anterior tibial syndrome, right leg: Secondary | ICD-10-CM | POA: Diagnosis not present

## 2019-09-05 DIAGNOSIS — W228XXA Striking against or struck by other objects, initial encounter: Secondary | ICD-10-CM | POA: Diagnosis not present

## 2019-09-05 DIAGNOSIS — S8011XA Contusion of right lower leg, initial encounter: Secondary | ICD-10-CM | POA: Diagnosis not present

## 2019-10-04 DIAGNOSIS — H524 Presbyopia: Secondary | ICD-10-CM | POA: Diagnosis not present

## 2019-10-04 DIAGNOSIS — H26492 Other secondary cataract, left eye: Secondary | ICD-10-CM | POA: Diagnosis not present

## 2019-10-04 DIAGNOSIS — S0501XA Injury of conjunctiva and corneal abrasion without foreign body, right eye, initial encounter: Secondary | ICD-10-CM | POA: Diagnosis not present

## 2019-10-04 DIAGNOSIS — H43813 Vitreous degeneration, bilateral: Secondary | ICD-10-CM | POA: Diagnosis not present

## 2019-10-18 ENCOUNTER — Ambulatory Visit: Payer: PPO | Attending: Internal Medicine

## 2019-10-18 DIAGNOSIS — Z23 Encounter for immunization: Secondary | ICD-10-CM

## 2019-10-18 NOTE — Progress Notes (Signed)
   Covid-19 Vaccination Clinic  Name:  Eric Cochran    MRN: [id number] DOB: [birth year]  10/18/2019  [name] was observed post Covid-19 immunization for 15 minutes without incident. He was provided with Vaccine Information Sheet and instruction to access the V-Safe system.   [name] was instructed to call 911 with any severe reactions post vaccine: [name] Difficulty breathing  . Swelling of face and throat  . A fast heartbeat  . A bad rash all over body  . Dizziness and weakness

## 2019-10-20 DIAGNOSIS — H26492 Other secondary cataract, left eye: Secondary | ICD-10-CM | POA: Diagnosis not present

## 2019-12-16 DIAGNOSIS — S8391XA Sprain of unspecified site of right knee, initial encounter: Secondary | ICD-10-CM | POA: Diagnosis not present

## 2020-01-06 DIAGNOSIS — R972 Elevated prostate specific antigen [PSA]: Secondary | ICD-10-CM | POA: Diagnosis not present

## 2020-01-13 DIAGNOSIS — R972 Elevated prostate specific antigen [PSA]: Secondary | ICD-10-CM | POA: Diagnosis not present

## 2020-01-13 DIAGNOSIS — N400 Enlarged prostate without lower urinary tract symptoms: Secondary | ICD-10-CM | POA: Diagnosis not present

## 2020-03-06 DIAGNOSIS — L570 Actinic keratosis: Secondary | ICD-10-CM | POA: Diagnosis not present

## 2020-03-06 DIAGNOSIS — Z85828 Personal history of other malignant neoplasm of skin: Secondary | ICD-10-CM | POA: Diagnosis not present

## 2020-03-06 DIAGNOSIS — D225 Melanocytic nevi of trunk: Secondary | ICD-10-CM | POA: Diagnosis not present

## 2020-03-06 DIAGNOSIS — L814 Other melanin hyperpigmentation: Secondary | ICD-10-CM | POA: Diagnosis not present

## 2020-03-06 DIAGNOSIS — L821 Other seborrheic keratosis: Secondary | ICD-10-CM | POA: Diagnosis not present

## 2020-03-06 DIAGNOSIS — L578 Other skin changes due to chronic exposure to nonionizing radiation: Secondary | ICD-10-CM | POA: Diagnosis not present

## 2020-03-06 DIAGNOSIS — L719 Rosacea, unspecified: Secondary | ICD-10-CM | POA: Diagnosis not present

## 2020-03-07 ENCOUNTER — Telehealth: Payer: Self-pay

## 2020-03-07 NOTE — Telephone Encounter (Signed)
  Patient Consent for Virtual Visit         Eric Cochran has provided verbal consent on 03/07/2020 for a virtual visit (video or telephone).   CONSENT FOR VIRTUAL VISIT FOR:  Eric Cochran  By participating in this virtual visit I agree to the following:  I hereby voluntarily request, consent and authorize [facility] and its employed or contracted physicians, physician assistants, nurse practitioners or other licensed health care professionals (the Practitioner), to provide me with telemedicine health care services (the "Services") as deemed necessary by the treating Practitioner. I acknowledge and consent to receive the Services by the Practitioner via telemedicine. I understand that the telemedicine visit will involve communicating with the Practitioner through live audiovisual communication technology and the disclosure of certain medical information by electronic transmission. I acknowledge that I have been given the opportunity to request an in-person assessment or other available alternative prior to the telemedicine visit and am voluntarily participating in the telemedicine visit.  I understand that I have the right to withhold or withdraw my consent to the use of telemedicine in the course of my care at any time, without affecting my right to future care or treatment, and that the Practitioner or I may terminate the telemedicine visit at any time. I understand that I have the right to inspect all information obtained and/or recorded in the course of the telemedicine visit and may receive copies of available information for a reasonable fee.  I understand that some of the potential risks of receiving the Services via telemedicine include:  [name] Delay or interruption in medical evaluation due to technological equipment failure or disruption; . Information transmitted may not be sufficient (e.g. poor resolution of images) to allow for appropriate medical decision making by the  Practitioner; and/or  . In rare instances, security protocols could fail, causing a breach of personal health information.  Furthermore, I acknowledge that it is my responsibility to provide information about my medical history, conditions and care that is complete and accurate to the best of my ability. I acknowledge that Practitioner's advice, recommendations, and/or decision may be based on factors not within their control, such as incomplete or inaccurate data provided by me or distortions of diagnostic images or specimens that may result from electronic transmissions. I understand that the practice of medicine is not an exact science and that Practitioner makes no warranties or guarantees regarding treatment outcomes. I acknowledge that a copy of this consent can be made available to me via my patient portal ([facility]), or I can request a printed copy by calling the office of [facility].    I understand that my insurance will be billed for this visit.   I have read or had this consent read to me. . I understand the contents of this consent, which adequately explains the benefits and risks of the Services being provided via telemedicine.  . I have been provided ample opportunity to ask questions regarding this consent and the Services and have had my questions answered to my satisfaction. . I give my informed consent for the services to be provided through the use of telemedicine in my medical care

## 2020-03-08 ENCOUNTER — Telehealth: Payer: PPO | Admitting: Cardiology

## 2020-03-14 ENCOUNTER — Telehealth: Payer: Self-pay | Admitting: *Deleted

## 2020-03-14 ENCOUNTER — Encounter: Payer: Self-pay | Admitting: Cardiology

## 2020-03-14 ENCOUNTER — Other Ambulatory Visit: Payer: Self-pay

## 2020-03-14 ENCOUNTER — Telehealth (INDEPENDENT_AMBULATORY_CARE_PROVIDER_SITE_OTHER): Payer: PPO | Admitting: Cardiology

## 2020-03-14 VITALS — BP 122/78 | HR 63 | Ht 67.75 in | Wt 208.0 lb

## 2020-03-14 DIAGNOSIS — G4733 Obstructive sleep apnea (adult) (pediatric): Secondary | ICD-10-CM | POA: Diagnosis not present

## 2020-03-14 DIAGNOSIS — E669 Obesity, unspecified: Secondary | ICD-10-CM

## 2020-03-14 NOTE — Telephone Encounter (Signed)
-----   Message from Antonieta Iba, RN sent at 03/14/2020  8:49 AM EST ----- Please order new PAP supplies for this patient.  Thanks!

## 2020-03-14 NOTE — Progress Notes (Signed)
Virtual Visit via Video Note   This visit type was conducted due to national recommendations for restrictions regarding the COVID-19 Pandemic (e.g. social distancing) in an effort to limit this patient's exposure and mitigate transmission in our community.  Due to his co-morbid illnesses, this patient is at least at moderate risk for complications without adequate follow up.  This format is felt to be most appropriate for this patient at this time.  All issues noted in this document were discussed and addressed.  A limited physical exam was performed with this format.  Please refer to the patient's chart for his consent to telehealth for [facility].   Evaluation Performed:  Follow-up visit  This visit type was conducted due to national recommendations for restrictions regarding the COVID-19 Pandemic (e.g. social distancing).  This format is felt to be most appropriate for this patient at this time.  All issues noted in this document were discussed and addressed.  No physical exam was performed (except for noted visual exam findings with Video Visits).  Please refer to the patient's chart (MyChart message for video visits and phone note for telephone visits) for the patient's consent to telehealth for [facility].  Date:  03/14/2020   ID:  Eric Cochran, DOB 04/03/1950, MRN [id number]  Patient Location:  Home  Provider location:   [place]  PCP:  Hulan Fess, MD  Sleep Medicine:  Fransico Him, MD  Electrophysiologist:  None   Chief Complaint:  OSA  History of Present Illness:    Eric Cochran is a 70 y.o. male who presents via audio/video conferencing for a telehealth visit today.    Eric Cochran is a 70 y.o. male with a hx of severe OSA on BiPAP auto ASV and obesity.  He is doing well with his CPAP device and thinks that he has gotten used to it.  He tolerates the mask and feels the pressure is adequate.  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  He denies any significant mouth or nasal dryness but occasionally wakes up with some nasal congestion and runny nose.  He does not think that he snores.    The patient does not have symptoms concerning for COVID-19 infection (fever, chills, cough, or new shortness of breath).   Prior CV studies:   The following studies were reviewed today:  PAP compliance download  Past Medical History:  Diagnosis Date  . Arthritis   . Obesity (BMI 30-39.9)   . Obstructive sleep apnea    severe- on VPAP auto   . Perirectal abscess    Past Surgical History:  Procedure Laterality Date  . ANKLE SURGERY    . BACK SURGERY    . HERNIA REPAIR     umb hernia  . INCISION AND DRAINAGE PERIRECTAL ABSCESS  04/08/2011   Procedure: IRRIGATION AND DEBRIDEMENT PERIRECTAL ABSCESS;  Surgeon: Joyice Faster. Cornett, MD;  Location: [place];  Service: General;  Laterality: N/A;  . LIGAMENT REPAIR     right arm ulna  . NASAL SINUS SURGERY       Current Meds  Medication Sig  . fluoruracil (CARAC) 0.5 % cream Apply topically daily.  . metroNIDAZOLE (METROGEL) 1 % gel Apply 1 application topically daily as needed. For nose     Allergies:  Lactose intolerance (gi)   Social History   Tobacco Use  . Smoking status: Never Smoker  . Smokeless tobacco: Never Used  Substance Use Topics  . Alcohol use: No  . Drug use: No     Family Hx: The patient's family history includes Cancer in his father and mother; Heart disease in his father.  ROS:   Please see the history of present illness.     All other systems reviewed and are negative.   Labs/Other Tests and Data Reviewed:    Recent Labs: No results found for requested labs within last 8760 hours.   Recent Lipid Panel No results found for: CHOL, TRIG, HDL, CHOLHDL, LDLCALC, LDLDIRECT  Wt Readings  from Last 3 Encounters:  03/14/20 208 lb (94.3 kg)  02/23/19 208 lb (94.3 kg)  02/10/18 215 lb (97.5 kg)     Objective:    Vital Signs:  BP 122/78   Pulse 63   Ht 5' 7.75" (1.721 m)   Wt 208 lb (94.3 kg)   BMI 31.86 kg/m    Well nourished, well developed male in no acute distress. Well appearing, alert and conversant, regular work of breathing,  good skin color  Eyes- anicteric mouth- oral mucosa is pink  neuro- grossly intact skin- no apparent rash or lesions or cyanosis   ASSESSMENT & PLAN:    1.  OSA -  The patient is tolerating PAP therapy well without any problems. The PAP download was reviewed today and showed an AHI of 2.6/hr on 14/10 cm H2O with 100% compliance in using more than 4 hours nightly.  The patient has been using and benefiting from PAP use and will continue to benefit from therapy.   2.  Obesity -his weight has been stable.   -he walks his dogs daily for about 3 miles 2-3 times weekly -I encouraged him to try to cut back on carbs and portions and eat more fruits and veggies  COVID-19 Education: The signs and symptoms of COVID-19 were discussed with the patient and how to seek care for testing (follow up with PCP or arrange E-visit).  The importance of social distancing was discussed today.  Patient Risk:   After full review of this patient's clinical status, I feel that they are at least moderate risk at this time.  Time:   Today, I have spent 20 minutes on telemedicine discussing medical problems including OSA and obesity and reviewing PAP compliance download.  Medication Adjustments/Labs and Tests Ordered: Current medicines are reviewed at length with the patient today.  Concerns regarding medicines are outlined above.  Tests Ordered: No orders of the defined types were placed in this encounter.  Medication Changes: No orders of the defined types were placed in this encounter.   Disposition:  Follow up in 1 year(s)  Signed, Fransico Him, MD   03/14/2020 8:33 AM    [facility]

## 2020-03-14 NOTE — Telephone Encounter (Signed)
cpap supplies order placed to [facility] via fax.

## 2020-04-05 DIAGNOSIS — G4730 Sleep apnea, unspecified: Secondary | ICD-10-CM | POA: Diagnosis not present

## 2020-04-05 DIAGNOSIS — G4733 Obstructive sleep apnea (adult) (pediatric): Secondary | ICD-10-CM | POA: Diagnosis not present

## 2020-04-16 DIAGNOSIS — Z1389 Encounter for screening for other disorder: Secondary | ICD-10-CM | POA: Diagnosis not present

## 2020-04-16 DIAGNOSIS — Z0000 Encounter for general adult medical examination without abnormal findings: Secondary | ICD-10-CM | POA: Diagnosis not present

## 2020-04-23 DIAGNOSIS — Z0000 Encounter for general adult medical examination without abnormal findings: Secondary | ICD-10-CM | POA: Diagnosis not present

## 2020-04-23 DIAGNOSIS — E782 Mixed hyperlipidemia: Secondary | ICD-10-CM | POA: Diagnosis not present

## 2020-04-23 DIAGNOSIS — R5383 Other fatigue: Secondary | ICD-10-CM | POA: Diagnosis not present

## 2020-05-15 ENCOUNTER — Other Ambulatory Visit: Payer: Self-pay | Admitting: Family Medicine

## 2020-05-15 DIAGNOSIS — E782 Mixed hyperlipidemia: Secondary | ICD-10-CM

## 2020-06-11 ENCOUNTER — Ambulatory Visit
Admission: RE | Admit: 2020-06-11 | Discharge: 2020-06-11 | Disposition: A | Discharge status: Home / Self Care | Payer: No Typology Code available for payment source | Source: Ambulatory Visit | Attending: Family Medicine | Admitting: Family Medicine

## 2020-06-11 DIAGNOSIS — E782 Mixed hyperlipidemia: Secondary | ICD-10-CM

## 2020-08-07 DIAGNOSIS — E7800 Pure hypercholesterolemia, unspecified: Secondary | ICD-10-CM | POA: Diagnosis not present

## 2020-10-30 DIAGNOSIS — H43813 Vitreous degeneration, bilateral: Secondary | ICD-10-CM | POA: Diagnosis not present

## 2020-10-30 DIAGNOSIS — H0100A Unspecified blepharitis right eye, upper and lower eyelids: Secondary | ICD-10-CM | POA: Diagnosis not present

## 2020-10-30 DIAGNOSIS — H524 Presbyopia: Secondary | ICD-10-CM | POA: Diagnosis not present

## 2020-10-30 DIAGNOSIS — H0100B Unspecified blepharitis left eye, upper and lower eyelids: Secondary | ICD-10-CM | POA: Diagnosis not present

## 2020-11-22 DIAGNOSIS — G4730 Sleep apnea, unspecified: Secondary | ICD-10-CM | POA: Diagnosis not present

## 2020-11-22 DIAGNOSIS — G4733 Obstructive sleep apnea (adult) (pediatric): Secondary | ICD-10-CM | POA: Diagnosis not present

## 2020-12-14 DIAGNOSIS — M545 Low back pain, unspecified: Secondary | ICD-10-CM | POA: Diagnosis not present

## 2020-12-18 DIAGNOSIS — M545 Low back pain, unspecified: Secondary | ICD-10-CM | POA: Diagnosis not present

## 2020-12-21 DIAGNOSIS — M545 Low back pain, unspecified: Secondary | ICD-10-CM | POA: Diagnosis not present

## 2020-12-26 DIAGNOSIS — M545 Low back pain, unspecified: Secondary | ICD-10-CM | POA: Diagnosis not present

## 2021-01-01 DIAGNOSIS — L821 Other seborrheic keratosis: Secondary | ICD-10-CM | POA: Diagnosis not present

## 2021-01-01 DIAGNOSIS — L820 Inflamed seborrheic keratosis: Secondary | ICD-10-CM | POA: Diagnosis not present

## 2021-01-01 DIAGNOSIS — D485 Neoplasm of uncertain behavior of skin: Secondary | ICD-10-CM | POA: Diagnosis not present

## 2021-01-01 DIAGNOSIS — Z23 Encounter for immunization: Secondary | ICD-10-CM | POA: Diagnosis not present

## 2021-01-02 DIAGNOSIS — M545 Low back pain, unspecified: Secondary | ICD-10-CM | POA: Diagnosis not present

## 2021-01-08 DIAGNOSIS — M545 Low back pain, unspecified: Secondary | ICD-10-CM | POA: Diagnosis not present

## 2021-01-17 DIAGNOSIS — R972 Elevated prostate specific antigen [PSA]: Secondary | ICD-10-CM | POA: Diagnosis not present

## 2021-01-23 DIAGNOSIS — M545 Low back pain, unspecified: Secondary | ICD-10-CM | POA: Diagnosis not present

## 2021-01-24 DIAGNOSIS — R972 Elevated prostate specific antigen [PSA]: Secondary | ICD-10-CM | POA: Diagnosis not present

## 2021-01-24 DIAGNOSIS — N400 Enlarged prostate without lower urinary tract symptoms: Secondary | ICD-10-CM | POA: Diagnosis not present

## 2021-01-25 DIAGNOSIS — M545 Low back pain, unspecified: Secondary | ICD-10-CM | POA: Diagnosis not present

## 2021-01-29 ENCOUNTER — Other Ambulatory Visit: Payer: Self-pay | Admitting: Urology

## 2021-01-29 DIAGNOSIS — M545 Low back pain, unspecified: Secondary | ICD-10-CM | POA: Diagnosis not present

## 2021-01-29 DIAGNOSIS — R972 Elevated prostate specific antigen [PSA]: Secondary | ICD-10-CM

## 2021-02-08 DIAGNOSIS — K5730 Diverticulosis of large intestine without perforation or abscess without bleeding: Secondary | ICD-10-CM | POA: Diagnosis not present

## 2021-02-08 DIAGNOSIS — K429 Umbilical hernia without obstruction or gangrene: Secondary | ICD-10-CM | POA: Diagnosis not present

## 2021-02-08 DIAGNOSIS — R3915 Urgency of urination: Secondary | ICD-10-CM | POA: Diagnosis not present

## 2021-02-08 DIAGNOSIS — R3914 Feeling of incomplete bladder emptying: Secondary | ICD-10-CM | POA: Diagnosis not present

## 2021-02-08 DIAGNOSIS — R350 Frequency of micturition: Secondary | ICD-10-CM | POA: Diagnosis not present

## 2021-02-08 DIAGNOSIS — N281 Cyst of kidney, acquired: Secondary | ICD-10-CM | POA: Diagnosis not present

## 2021-02-08 DIAGNOSIS — M549 Dorsalgia, unspecified: Secondary | ICD-10-CM | POA: Diagnosis not present

## 2021-02-08 DIAGNOSIS — N401 Enlarged prostate with lower urinary tract symptoms: Secondary | ICD-10-CM | POA: Diagnosis not present

## 2021-02-20 DIAGNOSIS — M79672 Pain in left foot: Secondary | ICD-10-CM | POA: Diagnosis not present

## 2021-02-20 DIAGNOSIS — M21622 Bunionette of left foot: Secondary | ICD-10-CM | POA: Diagnosis not present

## 2021-02-20 DIAGNOSIS — M2022 Hallux rigidus, left foot: Secondary | ICD-10-CM | POA: Diagnosis not present

## 2021-02-20 DIAGNOSIS — M21619 Bunion of unspecified foot: Secondary | ICD-10-CM | POA: Diagnosis not present

## 2021-02-20 DIAGNOSIS — M79671 Pain in right foot: Secondary | ICD-10-CM | POA: Diagnosis not present

## 2021-02-28 ENCOUNTER — Ambulatory Visit
Admission: RE | Admit: 2021-02-28 | Discharge: 2021-02-28 | Disposition: A | Discharge status: Home / Self Care | Payer: PPO | Source: Ambulatory Visit | Attending: Urology | Admitting: Urology

## 2021-02-28 ENCOUNTER — Other Ambulatory Visit: Payer: Self-pay

## 2021-02-28 DIAGNOSIS — R972 Elevated prostate specific antigen [PSA]: Secondary | ICD-10-CM

## 2021-02-28 MED ORDER — GADOBENATE DIMEGLUMINE 529 MG/ML IV SOLN
19.0000 mL | Freq: Once | INTRAVENOUS | Status: AC | PRN
  Administered 2021-02-28: 19 mL via INTRAVENOUS

## 2021-03-05 DIAGNOSIS — M2022 Hallux rigidus, left foot: Secondary | ICD-10-CM | POA: Diagnosis not present

## 2021-03-05 DIAGNOSIS — G8918 Other acute postprocedural pain: Secondary | ICD-10-CM | POA: Diagnosis not present

## 2021-03-06 DIAGNOSIS — L719 Rosacea, unspecified: Secondary | ICD-10-CM | POA: Diagnosis not present

## 2021-03-06 DIAGNOSIS — Z23 Encounter for immunization: Secondary | ICD-10-CM | POA: Diagnosis not present

## 2021-03-06 DIAGNOSIS — D225 Melanocytic nevi of trunk: Secondary | ICD-10-CM | POA: Diagnosis not present

## 2021-03-06 DIAGNOSIS — L603 Nail dystrophy: Secondary | ICD-10-CM | POA: Diagnosis not present

## 2021-03-06 DIAGNOSIS — L578 Other skin changes due to chronic exposure to nonionizing radiation: Secondary | ICD-10-CM | POA: Diagnosis not present

## 2021-03-06 DIAGNOSIS — Z85828 Personal history of other malignant neoplasm of skin: Secondary | ICD-10-CM | POA: Diagnosis not present

## 2021-03-06 DIAGNOSIS — L814 Other melanin hyperpigmentation: Secondary | ICD-10-CM | POA: Diagnosis not present

## 2021-03-06 DIAGNOSIS — L821 Other seborrheic keratosis: Secondary | ICD-10-CM | POA: Diagnosis not present

## 2021-03-14 DIAGNOSIS — R262 Difficulty in walking, not elsewhere classified: Secondary | ICD-10-CM | POA: Diagnosis not present

## 2021-03-14 DIAGNOSIS — M4807 Spinal stenosis, lumbosacral region: Secondary | ICD-10-CM | POA: Diagnosis not present

## 2021-03-14 DIAGNOSIS — M4328 Fusion of spine, sacral and sacrococcygeal region: Secondary | ICD-10-CM | POA: Diagnosis not present

## 2021-03-20 DIAGNOSIS — M4807 Spinal stenosis, lumbosacral region: Secondary | ICD-10-CM | POA: Diagnosis not present

## 2021-03-20 DIAGNOSIS — R262 Difficulty in walking, not elsewhere classified: Secondary | ICD-10-CM | POA: Diagnosis not present

## 2021-03-20 DIAGNOSIS — M4328 Fusion of spine, sacral and sacrococcygeal region: Secondary | ICD-10-CM | POA: Diagnosis not present

## 2021-03-25 DIAGNOSIS — M4807 Spinal stenosis, lumbosacral region: Secondary | ICD-10-CM | POA: Diagnosis not present

## 2021-03-25 DIAGNOSIS — R262 Difficulty in walking, not elsewhere classified: Secondary | ICD-10-CM | POA: Diagnosis not present

## 2021-03-25 DIAGNOSIS — R260 Ataxic gait: Secondary | ICD-10-CM | POA: Diagnosis not present

## 2021-03-25 DIAGNOSIS — M4328 Fusion of spine, sacral and sacrococcygeal region: Secondary | ICD-10-CM | POA: Diagnosis not present

## 2021-03-25 DIAGNOSIS — M25675 Stiffness of left foot, not elsewhere classified: Secondary | ICD-10-CM | POA: Diagnosis not present

## 2021-03-28 DIAGNOSIS — R262 Difficulty in walking, not elsewhere classified: Secondary | ICD-10-CM | POA: Diagnosis not present

## 2021-03-28 DIAGNOSIS — M4807 Spinal stenosis, lumbosacral region: Secondary | ICD-10-CM | POA: Diagnosis not present

## 2021-03-28 DIAGNOSIS — R260 Ataxic gait: Secondary | ICD-10-CM | POA: Diagnosis not present

## 2021-03-28 DIAGNOSIS — M4328 Fusion of spine, sacral and sacrococcygeal region: Secondary | ICD-10-CM | POA: Diagnosis not present

## 2021-03-28 DIAGNOSIS — M25675 Stiffness of left foot, not elsewhere classified: Secondary | ICD-10-CM | POA: Diagnosis not present

## 2021-04-03 DIAGNOSIS — Z79899 Other long term (current) drug therapy: Secondary | ICD-10-CM | POA: Diagnosis not present

## 2021-04-03 DIAGNOSIS — Z0001 Encounter for general adult medical examination with abnormal findings: Secondary | ICD-10-CM | POA: Diagnosis not present

## 2021-04-03 DIAGNOSIS — E7800 Pure hypercholesterolemia, unspecified: Secondary | ICD-10-CM | POA: Diagnosis not present

## 2021-04-04 DIAGNOSIS — M25675 Stiffness of left foot, not elsewhere classified: Secondary | ICD-10-CM | POA: Diagnosis not present

## 2021-04-04 DIAGNOSIS — R260 Ataxic gait: Secondary | ICD-10-CM | POA: Diagnosis not present

## 2021-04-15 DIAGNOSIS — M79672 Pain in left foot: Secondary | ICD-10-CM | POA: Diagnosis not present

## 2021-04-15 DIAGNOSIS — M2022 Hallux rigidus, left foot: Secondary | ICD-10-CM | POA: Diagnosis not present

## 2021-04-15 DIAGNOSIS — Z4889 Encounter for other specified surgical aftercare: Secondary | ICD-10-CM | POA: Diagnosis not present

## 2021-05-13 DIAGNOSIS — Z4889 Encounter for other specified surgical aftercare: Secondary | ICD-10-CM | POA: Diagnosis not present

## 2021-05-13 DIAGNOSIS — M2022 Hallux rigidus, left foot: Secondary | ICD-10-CM | POA: Diagnosis not present

## 2021-05-27 DIAGNOSIS — R293 Abnormal posture: Secondary | ICD-10-CM | POA: Diagnosis not present

## 2021-05-27 DIAGNOSIS — R972 Elevated prostate specific antigen [PSA]: Secondary | ICD-10-CM | POA: Diagnosis not present

## 2021-05-27 DIAGNOSIS — M25475 Effusion, left foot: Secondary | ICD-10-CM | POA: Diagnosis not present

## 2021-05-27 DIAGNOSIS — R2689 Other abnormalities of gait and mobility: Secondary | ICD-10-CM | POA: Diagnosis not present

## 2021-05-27 DIAGNOSIS — M25675 Stiffness of left foot, not elsewhere classified: Secondary | ICD-10-CM | POA: Diagnosis not present

## 2021-05-30 DIAGNOSIS — R293 Abnormal posture: Secondary | ICD-10-CM | POA: Diagnosis not present

## 2021-05-30 DIAGNOSIS — M25675 Stiffness of left foot, not elsewhere classified: Secondary | ICD-10-CM | POA: Diagnosis not present

## 2021-05-30 DIAGNOSIS — M25475 Effusion, left foot: Secondary | ICD-10-CM | POA: Diagnosis not present

## 2021-05-30 DIAGNOSIS — R2689 Other abnormalities of gait and mobility: Secondary | ICD-10-CM | POA: Diagnosis not present

## 2021-06-04 DIAGNOSIS — M25675 Stiffness of left foot, not elsewhere classified: Secondary | ICD-10-CM | POA: Diagnosis not present

## 2021-06-04 DIAGNOSIS — M25475 Effusion, left foot: Secondary | ICD-10-CM | POA: Diagnosis not present

## 2021-06-04 DIAGNOSIS — R2689 Other abnormalities of gait and mobility: Secondary | ICD-10-CM | POA: Diagnosis not present

## 2021-06-04 DIAGNOSIS — R293 Abnormal posture: Secondary | ICD-10-CM | POA: Diagnosis not present

## 2021-06-06 ENCOUNTER — Ambulatory Visit: Payer: PPO | Attending: Internal Medicine

## 2021-06-06 ENCOUNTER — Other Ambulatory Visit (HOSPITAL_BASED_OUTPATIENT_CLINIC_OR_DEPARTMENT_OTHER): Payer: Self-pay

## 2021-06-06 DIAGNOSIS — Z23 Encounter for immunization: Secondary | ICD-10-CM

## 2021-06-06 MED ORDER — PFIZER COVID-19 VAC BIVALENT 30 MCG/0.3ML IM SUSP
INTRAMUSCULAR | 0 refills | Status: DC
  Filled 2021-06-06: qty 0.3, 1d supply, fill #0

## 2021-06-06 NOTE — Progress Notes (Signed)
? ?  Covid-19 Vaccination Clinic ? ?Name:  Eric Cochran    ?MRN: [id number] ?DOB: 1950/09/10 ? ?06/06/2021 ? ?Mr. Ruhlman was observed post Covid-19 immunization for 15 minutes without incident. He was provided with Vaccine Information Sheet and instruction to access the V-Safe system.  ? ?Mr. Schnepf was instructed to call 911 with any severe reactions post vaccine: ?Difficulty breathing  ?Swelling of face and throat  ?A fast heartbeat  ?A bad rash all over body  ?Dizziness and weakness  ? ?Immunizations Administered   ? ? Name Date Dose VIS Date Route  ? Ambulance person Booster 06/06/2021  1:34 PM 0.3 mL 10/03/2020 Intramuscular  ? Manufacturer: [place]: [id number]  ? [facility]: [phone]  ? ?  ? ? ?

## 2021-07-22 DIAGNOSIS — R972 Elevated prostate specific antigen [PSA]: Secondary | ICD-10-CM | POA: Diagnosis not present

## 2021-08-08 ENCOUNTER — Ambulatory Visit: Payer: PPO | Admitting: Cardiology

## 2021-10-04 ENCOUNTER — Encounter: Payer: Self-pay | Admitting: Cardiology

## 2021-10-04 ENCOUNTER — Ambulatory Visit: Payer: PPO | Attending: Cardiology | Admitting: Cardiology

## 2021-10-04 VITALS — Ht 68.0 in | Wt 212.0 lb

## 2021-10-04 DIAGNOSIS — G4733 Obstructive sleep apnea (adult) (pediatric): Secondary | ICD-10-CM | POA: Diagnosis not present

## 2021-10-04 NOTE — Patient Instructions (Signed)
Medication Instructions:  Your physician recommends that you continue on your current medications as directed. Please refer to the Current Medication list given to you today.  *If you need a refill on your cardiac medications before your next appointment, please call your pharmacy*  Follow-Up: At [facility], you and your health needs are our priority.  As part of our continuing mission to provide you with exceptional heart care, we have created designated Provider Care Teams.  These Care Teams include your primary Cardiologist (physician) and Advanced Practice Providers (APPs -  Physician Assistants and Nurse Practitioners) who all work together to provide you with the care you need, when you need it.  Your next appointment:   1 year(s)  The format for your next appointment:   In Person  Provider:   Traci Turner, MD     Important Information About Sugar       

## 2021-10-04 NOTE — Progress Notes (Signed)
Virtual Visit via Video Note   This visit type was conducted due to national recommendations for restrictions regarding the COVID-19 Pandemic (e.g. social distancing) in an effort to limit this patient's exposure and mitigate transmission in our community.  Due to his co-morbid illnesses, this patient is at least at moderate risk for complications without adequate follow up.  This format is felt to be most appropriate for this patient at this time.  All issues noted in this document were discussed and addressed.  A limited physical exam was performed with this format.  Please refer to the patient's chart for his consent to telehealth for [facility].   Evaluation Performed:  Follow-up visit  This visit type was conducted due to national recommendations for restrictions regarding the COVID-19 Pandemic (e.g. social distancing).  This format is felt to be most appropriate for this patient at this time.  All issues noted in this document were discussed and addressed.  No physical exam was performed (except for noted visual exam findings with Video Visits).  Please refer to the patient's chart (MyChart message for video visits and phone note for telephone visits) for the patient's consent to telehealth for [facility].  Date:  10/04/2021   ID:  Eric Cochran, DOB 07-Mar-1950, MRN [id number]  Patient Location:  Home  Provider location:   [place]  PCP:  Lujean Amel, MD  Sleep Medicine:  Fransico Him, MD  Electrophysiologist:  None   Chief Complaint:  OSA  History of Present Illness:    Eric Cochran is a 71 y.o. male who presents via audio/video conferencing for a telehealth visit today.    Eric Cochran is a 71 y.o. male with a hx of severe OSA on BiPAP auto ASV and obesity.  He is doing well with his PAP device and thinks that he has gotten used to it.  He tolerates the mask and feels the pressure is adequate.  Since going on PAP he feels rested in the am and has no  significant daytime sleepiness.  He denies any significant mouth or nasal dryness or nasal congestion.  He does not think that he snores.    Prior CV studies:   The following studies were reviewed today:  PAP compliance download  Past Medical History:  Diagnosis Date   Arthritis    Obesity (BMI 30-39.9)    Obstructive sleep apnea    severe- on VPAP auto    Perirectal abscess    Past Surgical History:  Procedure Laterality Date   ANKLE SURGERY     BACK SURGERY     HERNIA REPAIR     umb hernia   INCISION AND DRAINAGE PERIRECTAL ABSCESS  04/08/2011   Procedure: IRRIGATION AND DEBRIDEMENT PERIRECTAL ABSCESS;  Surgeon: Joyice Faster. Cornett, MD;  Location: [place];  Service: General;  Laterality: N/A;   LIGAMENT REPAIR     right arm ulna   NASAL SINUS SURGERY       Current Meds  Medication Sig   COVID-19 mRNA bivalent vaccine, Pfizer, (PFIZER COVID-19 VAC BIVALENT) injection Inject into the muscle.   metroNIDAZOLE (METROGEL) 1 % gel Apply 1 application topically daily as needed. For nose     Allergies:   Lactose intolerance (gi)   Social History   Tobacco Use   Smoking status: Never   Smokeless tobacco: Never  Substance Use Topics   Alcohol use: No   Drug use: No     Family Hx: The patient's family history includes Cancer  in his father and mother; Heart disease in his father.  ROS:   Please see the history of present illness.     All other systems reviewed and are negative.   Labs/Other Tests and Data Reviewed:    Recent Labs: No results found for requested labs within last 365 days.   Recent Lipid Panel No results found for: "CHOL", "TRIG", "HDL", "CHOLHDL", "LDLCALC", "LDLDIRECT"  Wt Readings from Last 3 Encounters:  10/04/21 212 lb (96.2 kg)  03/14/20 208 lb (94.3 kg)  02/23/19 208 lb (94.3 kg)     Objective:    Vital Signs:  Ht '5\' 8"'$  (1.727 m)   Wt 212 lb (96.2 kg)   BMI 32.23 kg/m    Well nourished, well developed male in no acute distress. Well  appearing, alert and conversant, regular work of breathing,  good skin color  Eyes- anicteric mouth- oral mucosa is pink  neuro- grossly intact skin- no apparent rash or lesions or cyanosis   ASSESSMENT & PLAN:    1.  OSA - The patient is tolerating PAP therapy well without any problems. The PAP download performed by his DME was personally reviewed and interpreted by me today and showed an AHI of 0.9 on BiPAP at 14/10/hr  cm H2O with 100% compliance in using more than 4 hours nightly.  The patient has been using and benefiting from PAP use and will continue to benefit from therapy.    Time:   Today, I have spent 10 minutes on telemedicine discussing medical problems including OSA and reviewing PAP compliance download.  Medication Adjustments/Labs and Tests Ordered: Current medicines are reviewed at length with the patient today.  Concerns regarding medicines are outlined above.  Tests Ordered: No orders of the defined types were placed in this encounter.  Medication Changes: No orders of the defined types were placed in this encounter.   Disposition:  Follow up in 1 year(s)  Signed, Fransico Him, MD  10/04/2021 7:56 AM    [facility]

## 2021-10-30 ENCOUNTER — Other Ambulatory Visit (HOSPITAL_BASED_OUTPATIENT_CLINIC_OR_DEPARTMENT_OTHER): Payer: Self-pay

## 2021-10-30 MED ORDER — INFLUENZA VAC A&B SA ADJ QUAD 0.5 ML IM PRSY
PREFILLED_SYRINGE | INTRAMUSCULAR | 0 refills | Status: DC
  Filled 2021-10-30: qty 0.5, 1d supply, fill #0

## 2021-11-04 DIAGNOSIS — Z961 Presence of intraocular lens: Secondary | ICD-10-CM | POA: Diagnosis not present

## 2021-11-04 DIAGNOSIS — H18593 Other hereditary corneal dystrophies, bilateral: Secondary | ICD-10-CM | POA: Diagnosis not present

## 2021-11-04 DIAGNOSIS — H524 Presbyopia: Secondary | ICD-10-CM | POA: Diagnosis not present

## 2021-11-04 DIAGNOSIS — H353132 Nonexudative age-related macular degeneration, bilateral, intermediate dry stage: Secondary | ICD-10-CM | POA: Diagnosis not present

## 2022-01-02 DIAGNOSIS — J019 Acute sinusitis, unspecified: Secondary | ICD-10-CM | POA: Diagnosis not present

## 2022-01-02 DIAGNOSIS — U071 COVID-19: Secondary | ICD-10-CM | POA: Diagnosis not present

## 2022-01-13 DIAGNOSIS — R972 Elevated prostate specific antigen [PSA]: Secondary | ICD-10-CM | POA: Diagnosis not present

## 2022-01-20 DIAGNOSIS — R350 Frequency of micturition: Secondary | ICD-10-CM | POA: Diagnosis not present

## 2022-01-20 DIAGNOSIS — S0501XA Injury of conjunctiva and corneal abrasion without foreign body, right eye, initial encounter: Secondary | ICD-10-CM | POA: Diagnosis not present

## 2022-01-20 DIAGNOSIS — R972 Elevated prostate specific antigen [PSA]: Secondary | ICD-10-CM | POA: Diagnosis not present

## 2022-01-20 DIAGNOSIS — N401 Enlarged prostate with lower urinary tract symptoms: Secondary | ICD-10-CM | POA: Diagnosis not present

## 2022-01-22 DIAGNOSIS — S0502XD Injury of conjunctiva and corneal abrasion without foreign body, left eye, subsequent encounter: Secondary | ICD-10-CM | POA: Diagnosis not present

## 2022-01-24 DIAGNOSIS — H18831 Recurrent erosion of cornea, right eye: Secondary | ICD-10-CM | POA: Diagnosis not present

## 2022-01-24 DIAGNOSIS — H04121 Dry eye syndrome of right lacrimal gland: Secondary | ICD-10-CM | POA: Diagnosis not present

## 2022-01-29 DIAGNOSIS — H1820 Unspecified corneal edema: Secondary | ICD-10-CM | POA: Diagnosis not present

## 2022-01-29 DIAGNOSIS — S0502XD Injury of conjunctiva and corneal abrasion without foreign body, left eye, subsequent encounter: Secondary | ICD-10-CM | POA: Diagnosis not present

## 2022-02-22 DIAGNOSIS — J0140 Acute pansinusitis, unspecified: Secondary | ICD-10-CM | POA: Diagnosis not present

## 2022-03-24 DIAGNOSIS — Z85828 Personal history of other malignant neoplasm of skin: Secondary | ICD-10-CM | POA: Diagnosis not present

## 2022-03-24 DIAGNOSIS — L570 Actinic keratosis: Secondary | ICD-10-CM | POA: Diagnosis not present

## 2022-03-24 DIAGNOSIS — L578 Other skin changes due to chronic exposure to nonionizing radiation: Secondary | ICD-10-CM | POA: Diagnosis not present

## 2022-03-24 DIAGNOSIS — L821 Other seborrheic keratosis: Secondary | ICD-10-CM | POA: Diagnosis not present

## 2022-03-24 DIAGNOSIS — D225 Melanocytic nevi of trunk: Secondary | ICD-10-CM | POA: Diagnosis not present

## 2022-03-24 DIAGNOSIS — L820 Inflamed seborrheic keratosis: Secondary | ICD-10-CM | POA: Diagnosis not present

## 2022-03-24 DIAGNOSIS — L814 Other melanin hyperpigmentation: Secondary | ICD-10-CM | POA: Diagnosis not present

## 2022-03-24 DIAGNOSIS — L719 Rosacea, unspecified: Secondary | ICD-10-CM | POA: Diagnosis not present

## 2022-04-08 DIAGNOSIS — Z0001 Encounter for general adult medical examination with abnormal findings: Secondary | ICD-10-CM | POA: Diagnosis not present

## 2022-04-08 DIAGNOSIS — E7800 Pure hypercholesterolemia, unspecified: Secondary | ICD-10-CM | POA: Diagnosis not present

## 2022-04-08 DIAGNOSIS — R7309 Other abnormal glucose: Secondary | ICD-10-CM | POA: Diagnosis not present

## 2022-04-08 DIAGNOSIS — Z79899 Other long term (current) drug therapy: Secondary | ICD-10-CM | POA: Diagnosis not present

## 2022-07-18 DIAGNOSIS — G4733 Obstructive sleep apnea (adult) (pediatric): Secondary | ICD-10-CM | POA: Diagnosis not present

## 2022-07-18 DIAGNOSIS — G4730 Sleep apnea, unspecified: Secondary | ICD-10-CM | POA: Diagnosis not present

## 2022-07-22 DIAGNOSIS — R972 Elevated prostate specific antigen [PSA]: Secondary | ICD-10-CM | POA: Diagnosis not present

## 2022-10-03 DIAGNOSIS — H0014 Chalazion left upper eyelid: Secondary | ICD-10-CM | POA: Diagnosis not present

## 2022-10-09 ENCOUNTER — Ambulatory Visit: Payer: HMO | Attending: Cardiology | Admitting: Cardiology

## 2022-10-09 ENCOUNTER — Encounter: Payer: Self-pay | Admitting: Cardiology

## 2022-10-09 VITALS — BP 100/72 | HR 78 | Ht 67.75 in | Wt 206.8 lb

## 2022-10-09 DIAGNOSIS — G4733 Obstructive sleep apnea (adult) (pediatric): Secondary | ICD-10-CM | POA: Diagnosis not present

## 2022-10-09 NOTE — Progress Notes (Addendum)
Date:  10/09/2022   ID:  Jill Poling, DOB [birth year], MRN [id number]  PCP:  Darrow Bussing, MD  Sleep Medicine:  Armanda Magic, MD  Electrophysiologist:  None   Chief Complaint:  OSA  History of Present Illness:    Eric Cochran is a 72 y.o. male  with a hx of severe OSA on BiPAP auto ASV and obesity.  He is doing well with his PAP device and thinks that he has gotten used to it.  He tolerates the full face mask and feels the pressure is adequate.  Since going on PAP he feels rested in the am and has no significant daytime sleepiness.  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  He does not think that he snores.    Prior CV studies:   The following studies were reviewed today:  PAP compliance download  Past Medical History:  Diagnosis Date   Arthritis    Obesity (BMI 30-39.9)    Obstructive sleep apnea    severe- on VPAP auto    Perirectal abscess    Past Surgical History:  Procedure Laterality Date   ANKLE SURGERY     BACK SURGERY     HERNIA REPAIR     umb hernia   INCISION AND DRAINAGE PERIRECTAL ABSCESS  04/08/2011   Procedure: IRRIGATION AND DEBRIDEMENT PERIRECTAL ABSCESS;  Surgeon: Clovis Pu. Cornett, MD;  Location: [facility] OR;  Service: General;  Laterality: N/A;   LIGAMENT REPAIR     right arm ulna   NASAL SINUS SURGERY       Current Meds  Medication Sig   melatonin 3 MG TABS tablet Take 3 mg by mouth at bedtime.   metroNIDAZOLE (METROGEL) 1 % gel Apply 1 application topically daily as needed. For nose   [DISCONTINUED] COVID-19 mRNA bivalent vaccine, Pfizer, (PFIZER COVID-19 VAC BIVALENT) injection Inject into the muscle.   [DISCONTINUED] fluoruracil (CARAC) 0.5 % cream Apply topically daily.   [DISCONTINUED] influenza vaccine adjuvanted (FLUAD) 0.5 ML injection Inject into the muscle.     Allergies:   Lactose intolerance (gi)   Social History    Tobacco Use   Smoking status: Never   Smokeless tobacco: Never  Substance Use Topics   Alcohol use: No   Drug use: No     Family Hx: The patient's family history includes Cancer in his father and mother; Heart disease in his father.  ROS:   Please see the history of present illness.     All other systems reviewed and are negative.   Labs/Other Tests and Data Reviewed:    Recent Labs: No results found for requested labs within last 365 days.   Recent Lipid Panel No results found for: "CHOL", "TRIG", "HDL", "CHOLHDL", "LDLCALC", "LDLDIRECT"  Wt Readings from Last 3 Encounters:  10/09/22 206 lb 12.8 oz (93.8 kg)  10/04/21 212 lb (96.2 kg)  03/14/20 208 lb (94.3 kg)     Objective:    Vital Signs:  BP 100/72   Pulse 78   Ht 5' 7.75" (1.721 m)   Wt 206 lb 12.8 oz (93.8 kg)   SpO2 96%   BMI 31.68 kg/m    GEN: Well nourished, well developed in no acute distress HEENT: Normal NECK: No JVD; No carotid bruits LYMPHATICS: No lymphadenopathy CARDIAC:RRR, no murmurs, rubs, gallops RESPIRATORY:  Clear to auscultation without rales, wheezing or rhonchi  ABDOMEN: Soft, non-tender, non-distended MUSCULOSKELETAL:  No edema; No deformity  SKIN: Warm and dry NEUROLOGIC:  Alert and oriented x 3 PSYCHIATRIC:  Normal affect  ASSESSMENT & PLAN:    OSA - The patient is tolerating PAP therapy well without any problems. The PAP download performed by his DME was personally reviewed and interpreted by me today and showed an AHI of 1.1/hr on 14/10 cm H2O with 99% compliance in using more than 4 hours nightly.  The patient has been using and benefiting from PAP use and will continue to benefit from therapy.    Medication Adjustments/Labs and Tests Ordered: Current medicines are reviewed at length with the patient today.  Concerns regarding medicines are outlined above.  Tests Ordered: No orders of the defined types were placed in this encounter.  Medication Changes: No orders of  the defined types were placed in this encounter.   Disposition:  Follow up in 1 year(s)  Signed, Armanda Magic, MD  10/09/2022 10:50 AM    [facility]

## 2022-10-09 NOTE — Patient Instructions (Signed)

## 2022-10-15 DIAGNOSIS — M542 Cervicalgia: Secondary | ICD-10-CM | POA: Diagnosis not present

## 2022-10-15 DIAGNOSIS — M5416 Radiculopathy, lumbar region: Secondary | ICD-10-CM | POA: Diagnosis not present

## 2022-10-15 DIAGNOSIS — M48062 Spinal stenosis, lumbar region with neurogenic claudication: Secondary | ICD-10-CM | POA: Diagnosis not present

## 2022-10-15 DIAGNOSIS — Z6832 Body mass index (BMI) 32.0-32.9, adult: Secondary | ICD-10-CM | POA: Diagnosis not present

## 2022-10-27 DIAGNOSIS — Z978 Presence of other specified devices: Secondary | ICD-10-CM | POA: Diagnosis not present

## 2022-10-27 DIAGNOSIS — M79631 Pain in right forearm: Secondary | ICD-10-CM | POA: Diagnosis not present

## 2022-10-27 DIAGNOSIS — M65839 Other synovitis and tenosynovitis, unspecified forearm: Secondary | ICD-10-CM | POA: Diagnosis not present

## 2022-10-27 DIAGNOSIS — M79601 Pain in right arm: Secondary | ICD-10-CM | POA: Diagnosis not present

## 2022-10-29 DIAGNOSIS — M5416 Radiculopathy, lumbar region: Secondary | ICD-10-CM | POA: Diagnosis not present

## 2022-10-29 DIAGNOSIS — M542 Cervicalgia: Secondary | ICD-10-CM | POA: Diagnosis not present

## 2022-11-03 ENCOUNTER — Ambulatory Visit (HOSPITAL_BASED_OUTPATIENT_CLINIC_OR_DEPARTMENT_OTHER): Payer: HMO | Attending: Neurological Surgery | Admitting: Physical Therapy

## 2022-11-03 ENCOUNTER — Other Ambulatory Visit: Payer: Self-pay

## 2022-11-03 ENCOUNTER — Encounter (HOSPITAL_BASED_OUTPATIENT_CLINIC_OR_DEPARTMENT_OTHER): Payer: Self-pay | Admitting: Physical Therapy

## 2022-11-03 DIAGNOSIS — M62830 Muscle spasm of back: Secondary | ICD-10-CM | POA: Diagnosis not present

## 2022-11-03 DIAGNOSIS — M542 Cervicalgia: Secondary | ICD-10-CM | POA: Insufficient documentation

## 2022-11-03 NOTE — Therapy (Signed)
OUTPATIENT PHYSICAL THERAPY EVALUATION   Patient Name: Eric Cochran MRN: [id number] DOB:09-23-1950, 72 y.o., male Today's Date: 11/03/2022  END OF SESSION:  PT End of Session - 11/03/22 1611     Visit Number 1    Number of Visits 21    Date for PT Re-Evaluation 01/17/23    Authorization Type HTA    PT Start Time 1530    PT Stop Time 1611    PT Time Calculation (min) 41 min    Activity Tolerance Patient tolerated treatment well    Behavior During Therapy WFL for tasks assessed/performed             Past Medical History:  Diagnosis Date   Arthritis    Obesity (BMI 30-39.9)    Obstructive sleep apnea    severe- on VPAP auto    Perirectal abscess    Past Surgical History:  Procedure Laterality Date   ANKLE SURGERY     BACK SURGERY     HERNIA REPAIR     umb hernia   INCISION AND DRAINAGE PERIRECTAL ABSCESS  04/08/2011   Procedure: IRRIGATION AND DEBRIDEMENT PERIRECTAL ABSCESS;  Surgeon: Clovis Pu. Cornett, MD;  Location: [facility] OR;  Service: General;  Laterality: N/A;   LIGAMENT REPAIR     right arm ulna   NASAL SINUS SURGERY     Patient Active Problem List   Diagnosis Date Noted   Obstructive sleep apnea    Obesity (BMI 30-39.9)      REFERRING PROVIDER:  Dawley, Alan Mulder, DO    REFERRING DIAG: M54.2 (ICD-10-CM) - Cervicalgia   Dry Needling  Rationale for Evaluation and Treatment: Rehabilitation  THERAPY DIAG:  Cervicalgia  Muscle spasm of back  ONSET DATE: subacute on chronic   SUBJECTIVE:                                                                                                                                                                                           SUBJECTIVE STATEMENT: H/o spur removals and bunionectomy. Had episodes of muscle spasm on and off. If I am craeful, spasms will decrease. Has not done PT after surgeries. In the last couple of months, my back keeps spasming and getting sciatica. Feels like there is sand when I move  my neck, chin tucks make it pull. Both sides hurt but tend to turn my head to the left more often. HA for the last few days after taking prednisone. Denies N/T in UEs. When I wake in the AM hands and feet seem sort of numb.   PERTINENT HISTORY:  L5 S1 fusion bony, L3-4 plate fusion  PAIN:  Are you having pain? No  PRECAUTIONS:  None  RED FLAGS: None   WEIGHT BEARING RESTRICTIONS:  No  FALLS:  Has patient fallen in last 6 months? 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  DIAGNOSTIC FINDINGS:  No recent imaging available  PATIENT SURVEYS:  FOTO 50  POSTURE:  forward head, decreased lumbar lordosis, and decreased thoracic kyphosis  HAND DOMINANCE:  Right     Body Part #1 Cervical  PALPATION: Eval: Passive range of motion greater than active range of motion, soft stretchy end feel  CERVICAL ROM:   Active ROM A/PROM (deg) eval  Flexion 18  Extension 22  Right lateral flexion 14  Left lateral flexion 18  Right rotation 50  Left rotation 52   (Blank rows = not tested)   TREATMENT:                                                                                                                               DATE: Eval 9/30  Trigger Point Dry Needling, Manual Therapy Treatment:  Initial or subsequent education regarding Trigger Point Dry Needling: Initial - education time included in Manual Did patient give consent to treatment with Trigger Point Dry Needling: Yes TPDN with skilled palpation and monitoring followed by STM to the following muscles: bilat upper traps, suboccipitals, C3-4 Rt paraspinals  Supine chin tuck Supine pec stretch   PATIENT EDUCATION:  Education details: Teacher, music of condition, POC, HEP, exercise form/rationale Person educated:  Patient and Spouse Education method: Explanation, Demonstration, Tactile cues, Verbal cues, and Handouts Education comprehension: verbalized understanding, returned demonstration, verbal cues required, tactile cues required, and needs further education  HOME EXERCISE PROGRAM: [id number]   ASSESSMENT:  CLINICAL IMPRESSION: Patient is a 71y.o. male who was seen today for physical therapy evaluation and treatment for cervicalgia that has become consistent and associated with spinal spasm. [name]      REHAB POTENTIAL: Good  CLINICAL DECISION MAKING: Stable/uncomplicated  EVALUATION COMPLEXITY: Low   GOALS: Goals reviewed with patient? Yes  SHORT TERM GOALS: Target date: 10/26  Able to demo proper resting posture with awareness of correction of forward head.  Baseline: Goal status: INITIAL    LONG TERM GOALS: Target date: POC date  Meet FOTO goal Baseline:  Goal status: INITIAL  2.  AROM cervical rotation to 60 deg bil without discomfort Baseline:  Goal status: INITIAL  3.  Able to participate in activities without limitation by spasm Baseline:  Goal status: INITIAL  4.  Bil GHJ ER to 5/5 Baseline:  Goal status: INITIAL   PLAN:  PT FREQUENCY: 1-2x/week  PT DURATION: 10 weeks  PLANNED INTERVENTIONS: Therapeutic exercises, Therapeutic activity, Neuromuscular re-education, Patient/Family education, Self Care, Joint mobilization, Stair training, Aquatic Therapy, Dry  Needling, Electrical stimulation, Spinal mobilization, Cryotherapy, Moist heat, Taping, Ionotophoresis 4mg /ml Dexamethasone, Manual therapy, and Re-evaluation.  PLAN FOR NEXT SESSION: DN PRN, deep neck flexor & periscap strengthening.    Manas Hickling C. Deyci Gesell PT, DPT 11/03/22 8:32 PM

## 2022-11-07 ENCOUNTER — Other Ambulatory Visit (HOSPITAL_BASED_OUTPATIENT_CLINIC_OR_DEPARTMENT_OTHER): Payer: Self-pay

## 2022-11-07 MED ORDER — INFLUENZA VAC A&B SURF ANT ADJ 0.5 ML IM SUSY
0.5000 mL | PREFILLED_SYRINGE | Freq: Once | INTRAMUSCULAR | 0 refills | Status: AC
Start: 2022-11-07 — End: 2022-11-08
  Filled 2022-11-07: qty 0.5, 1d supply, fill #0

## 2022-11-10 ENCOUNTER — Encounter (HOSPITAL_BASED_OUTPATIENT_CLINIC_OR_DEPARTMENT_OTHER): Payer: Self-pay | Admitting: Physical Therapy

## 2022-11-10 ENCOUNTER — Ambulatory Visit (HOSPITAL_BASED_OUTPATIENT_CLINIC_OR_DEPARTMENT_OTHER): Payer: HMO | Attending: Neurological Surgery | Admitting: Physical Therapy

## 2022-11-10 DIAGNOSIS — M542 Cervicalgia: Secondary | ICD-10-CM | POA: Diagnosis not present

## 2022-11-10 DIAGNOSIS — M62830 Muscle spasm of back: Secondary | ICD-10-CM | POA: Insufficient documentation

## 2022-11-10 NOTE — Therapy (Signed)
OUTPATIENT PHYSICAL THERAPY Treatment   Patient Name: Eric Cochran MRN: [id number] DOB:1950/05/12, 72 y.o., male Today's Date: 11/10/2022  END OF SESSION:  PT End of Session - 11/10/22 0919     Visit Number 2    Number of Visits 21    Date for PT Re-Evaluation 01/17/23    Authorization Type HTA    PT Start Time 0930    PT Stop Time 1013    PT Time Calculation (min) 43 min    Activity Tolerance Patient tolerated treatment well    Behavior During Therapy WFL for tasks assessed/performed             Past Medical History:  Diagnosis Date   Arthritis    Obesity (BMI 30-39.9)    Obstructive sleep apnea    severe- on VPAP auto    Perirectal abscess    Past Surgical History:  Procedure Laterality Date   ANKLE SURGERY     BACK SURGERY     HERNIA REPAIR     umb hernia   INCISION AND DRAINAGE PERIRECTAL ABSCESS  04/08/2011   Procedure: IRRIGATION AND DEBRIDEMENT PERIRECTAL ABSCESS;  Surgeon: Clovis Pu. Cornett, MD;  Location: [facility] OR;  Service: General;  Laterality: N/A;   LIGAMENT REPAIR     right arm ulna   NASAL SINUS SURGERY     Patient Active Problem List   Diagnosis Date Noted   Obstructive sleep apnea    Obesity (BMI 30-39.9)      REFERRING PROVIDER:  Dawley, Alan Mulder, DO    REFERRING DIAG: M54.2 (ICD-10-CM) - Cervicalgia   Dry Needling  Rationale for Evaluation and Treatment: Rehabilitation  THERAPY DIAG:  Cervicalgia  Muscle spasm of back  ONSET DATE: subacute on chronic   SUBJECTIVE:                                                                                                                                                                                           SUBJECTIVE STATEMENT: Get really stiff if I sit still.   PERTINENT HISTORY:  L5 S1 fusion bony, L3-4 plate fusion  PAIN:  Are you having pain? No  PRECAUTIONS:  None  RED FLAGS: None   WEIGHT BEARING RESTRICTIONS:  No  FALLS:  Has patient fallen in last 6 months?  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  DIAGNOSTIC FINDINGS:  No recent imaging available  PATIENT SURVEYS:  FOTO 50  POSTURE:  forward head, decreased lumbar lordosis, and decreased thoracic kyphosis  HAND DOMINANCE:  Right     Body Part #1 Cervical  PALPATION: Eval: Passive range of motion greater than active range of motion, soft stretchy end feel  CERVICAL ROM:   Active ROM A/PROM (deg) eval  Flexion 18  Extension 22  Right lateral flexion 14  Left lateral flexion 18  Right rotation 50  Left rotation 52   (Blank rows = not tested)   TREATMENT:                                                                                                                               Treatment                            10/7:  Trigger Point Dry Needling, Manual Therapy Treatment:  Initial or subsequent education regarding Trigger Point Dry Needling: Subsequent Did patient give consent to treatment with Trigger Point Dry Needling: Yes TPDN with skilled palpation and monitoring followed by STM to the following muscles: Lt upper traps, bilat C4 paraspinals  Prone Lt rib mobilizaitons, first rib depression, STM Lt levator scap Supine cervical distraction + suboccipital release; also added chin tuck to traction Supine chin tuck+head lift Max hold 6s today Prone scapular retraction, retraction +UE extension- delay in motion of Lt scapula vs Rt notable today Row blue tband Lt UE D1 ext red tband Hooklying abd set + horiz abd blue tband   DATE: Eval 9/30  Trigger Point Dry Needling, Manual Therapy Treatment:  Initial or subsequent education regarding Trigger Point Dry Needling: Initial - education time included in Manual Did patient give consent to treatment with  Trigger Point Dry Needling: Yes TPDN with skilled palpation and monitoring followed by STM to the following muscles: bilat upper traps, suboccipitals, C3-4 Rt paraspinals  Supine chin tuck Supine pec stretch   PATIENT EDUCATION:  Education details: Teacher, music of condition, POC, HEP, exercise form/rationale Person educated: Patient and Spouse Education method: Explanation, Demonstration, Tactile cues, Verbal cues, and Handouts Education comprehension: verbalized understanding, returned demonstration, verbal cues required, tactile cues required, and needs further education  HOME EXERCISE PROGRAM: [id number]   ASSESSMENT:  CLINICAL IMPRESSION: Cuing required for Lt scapular mobility today but good tolerance without pain. Will continue to work on neck flexor training to gradually reduce forward head motion. Pt with h/o sternal pain that put him into a posture of Rt thoracic rotation+flexion- does have flare ups intermittently and seeing MD soon regarding Rt LE radicular symptoms- biomechanical interaction/connection seen with demonstrated postures.      REHAB POTENTIAL: Good  CLINICAL DECISION MAKING: Stable/uncomplicated  EVALUATION COMPLEXITY: Low   GOALS: Goals reviewed with patient? Yes  SHORT TERM GOALS: Target date: 10/26  Able to demo proper resting posture with awareness of correction of forward head.  Baseline: Goal status: INITIAL    LONG  TERM GOALS: Target date: POC date  Meet FOTO goal Baseline:  Goal status: INITIAL  2.  AROM cervical rotation to 60 deg bil without discomfort Baseline:  Goal status: INITIAL  3.  Able to participate in activities without limitation by spasm Baseline:  Goal status: INITIAL  4.  Bil GHJ ER to 5/5 Baseline:  Goal status: INITIAL   PLAN:  PT FREQUENCY: 1-2x/week  PT DURATION: 10 weeks  PLANNED INTERVENTIONS: Therapeutic exercises, Therapeutic activity, Neuromuscular re-education, Patient/Family education, Self Care,  Joint mobilization, Stair training, Aquatic Therapy, Dry Needling, Electrical stimulation, Spinal mobilization, Cryotherapy, Moist heat, Taping, Ionotophoresis 4mg /ml Dexamethasone, Manual therapy, and Re-evaluation.  PLAN FOR NEXT SESSION: DN PRN, deep neck flexor & periscap strengthening.    Dominga Mcduffie C. Alejandra Barna PT, DPT 11/10/22 10:15 AM

## 2022-11-12 DIAGNOSIS — H0100B Unspecified blepharitis left eye, upper and lower eyelids: Secondary | ICD-10-CM | POA: Diagnosis not present

## 2022-11-12 DIAGNOSIS — H18593 Other hereditary corneal dystrophies, bilateral: Secondary | ICD-10-CM | POA: Diagnosis not present

## 2022-11-12 DIAGNOSIS — H353132 Nonexudative age-related macular degeneration, bilateral, intermediate dry stage: Secondary | ICD-10-CM | POA: Diagnosis not present

## 2022-11-12 DIAGNOSIS — H0100A Unspecified blepharitis right eye, upper and lower eyelids: Secondary | ICD-10-CM | POA: Diagnosis not present

## 2022-11-13 ENCOUNTER — Encounter (HOSPITAL_BASED_OUTPATIENT_CLINIC_OR_DEPARTMENT_OTHER): Payer: Self-pay | Admitting: Physical Therapy

## 2022-11-13 ENCOUNTER — Ambulatory Visit (HOSPITAL_BASED_OUTPATIENT_CLINIC_OR_DEPARTMENT_OTHER): Payer: HMO | Admitting: Physical Therapy

## 2022-11-13 DIAGNOSIS — M62830 Muscle spasm of back: Secondary | ICD-10-CM

## 2022-11-13 DIAGNOSIS — M542 Cervicalgia: Secondary | ICD-10-CM

## 2022-11-13 NOTE — Therapy (Signed)
OUTPATIENT PHYSICAL THERAPY Treatment   Patient Name: Eric Cochran MRN: [id number] DOB:12-Jun-1950, 72 y.o., male Today's Date: 11/13/2022  END OF SESSION:  PT End of Session - 11/13/22 0717     Visit Number 3    Number of Visits 21    Date for PT Re-Evaluation 01/17/23    Authorization Type HTA    PT Start Time 0717    PT Stop Time 0757    PT Time Calculation (min) 40 min    Activity Tolerance Patient tolerated treatment well    Behavior During Therapy [facility] for tasks assessed/performed             Past Medical History:  Diagnosis Date   Arthritis    Obesity (BMI 30-39.9)    Obstructive sleep apnea    severe- on VPAP auto    Perirectal abscess    Past Surgical History:  Procedure Laterality Date   ANKLE SURGERY     BACK SURGERY     HERNIA REPAIR     umb hernia   INCISION AND DRAINAGE PERIRECTAL ABSCESS  04/08/2011   Procedure: IRRIGATION AND DEBRIDEMENT PERIRECTAL ABSCESS;  Surgeon: Clovis Pu. Cornett, MD;  Location: [facility] OR;  Service: General;  Laterality: N/A;   LIGAMENT REPAIR     right arm ulna   NASAL SINUS SURGERY     Patient Active Problem List   Diagnosis Date Noted   Obstructive sleep apnea    Obesity (BMI 30-39.9)      REFERRING PROVIDER:  Dawley, Alan Mulder, DO    REFERRING DIAG: M54.2 (ICD-10-CM) - Cervicalgia   Dry Needling  Rationale for Evaluation and Treatment: Rehabilitation  THERAPY DIAG:  Cervicalgia  Muscle spasm of back  ONSET DATE: subacute on chronic   SUBJECTIVE:                                                                                                                                                                                           SUBJECTIVE STATEMENT: Patient states hx of back issues that usually resolve. Have not resolved lately. Feels like PT is starting to help. HEP going well. Remaining active. DN has been helpful.  PERTINENT HISTORY:  L5 S1 fusion bony, L3-4 plate fusion  PAIN:  Are you having  pain? Yes: NPRS scale: 3/10 Pain location: LBP Pain description: spasms, general discomfort Aggravating factors:   Relieving factors: ice  PRECAUTIONS:  None  RED FLAGS: None   WEIGHT BEARING RESTRICTIONS:  No  FALLS:  Has patient fallen in last 6 months? 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  DIAGNOSTIC FINDINGS:  No recent imaging available  PATIENT SURVEYS:  FOTO 50  POSTURE:  forward head, decreased lumbar lordosis, and decreased thoracic kyphosis  HAND DOMINANCE:  Right     Body Part #1 Cervical  PALPATION: Eval: Passive range of motion greater than active range of motion, soft stretchy end feel  CERVICAL ROM:   Active ROM A/PROM (deg) eval 11/13/22  Flexion 18 35  Extension 22 35  Right lateral flexion 14 20  Left lateral flexion 18 18  Right rotation 50 52  Left rotation 52 55   (Blank rows = not tested)   TREATMENT:                                                                                                                              11/13/22 Manual: STM to cervical paraspinals, Grade II-III R and L UPA C3-C7, manual traction Seated cervical retraction 2 x 10  Standing bilateral ER GTB 2 x 10 Standing shoulder PNF D2 pattern GTB 2 x 10 Seated UT stretch 3 x 20 second holds   Treatment                            10/7:  Trigger Point Dry Needling, Manual Therapy Treatment:  Initial or subsequent education regarding Trigger Point Dry Needling: Subsequent Did patient give consent to treatment with Trigger Point Dry Needling: Yes TPDN with skilled palpation and monitoring followed by STM to the following muscles: Lt upper traps, bilat C4 paraspinals  Prone Lt rib mobilizaitons, first rib depression, STM Lt levator scap Supine  cervical distraction + suboccipital release; also added chin tuck to traction Supine chin tuck+head lift Max hold 6s today Prone scapular retraction, retraction +UE extension- delay in motion of Lt scapula vs Rt notable today Row blue tband Lt UE D1 ext red tband Hooklying abd set + horiz abd blue tband   DATE: Eval 9/30  Trigger Point Dry Needling, Manual Therapy Treatment:  Initial or subsequent education regarding Trigger Point Dry Needling: Initial - education time included in Manual Did patient give consent to treatment with Trigger Point Dry Needling: Yes TPDN with skilled palpation and monitoring followed by STM to the following muscles: bilat upper traps, suboccipitals, C3-4 Rt paraspinals  Supine chin tuck Supine pec stretch   PATIENT EDUCATION:  Education details: Teacher, music of condition, POC, HEP, exercise form/rationale Person educated: Patient and Spouse Education method: Explanation, Demonstration, Tactile cues, Verbal cues, and Handouts Education comprehension: verbalized understanding, returned demonstration, verbal cues required, tactile cues required, and needs further education  HOME EXERCISE PROGRAM: [id number]   ASSESSMENT:  CLINICAL IMPRESSION: Patient with tender and hyperactive cervical paraspinals and bilateral UT with R>L. Completed manual with decrease in tissue tension and some improvement in  ROM from initial evaluation. Continued with postural and periscapular strengthening and cueing provided for cervical retraction and posture with good carry over. Patient will continue to benefit from physical therapy in order to improve function and reduce impairment.      REHAB POTENTIAL: Good  CLINICAL DECISION MAKING: Stable/uncomplicated  EVALUATION COMPLEXITY: Low   GOALS: Goals reviewed with patient? Yes  SHORT TERM GOALS: Target date: 10/26  Able to demo proper resting posture with awareness of correction of forward head.  Baseline: Goal status:  INITIAL    LONG TERM GOALS: Target date: POC date  Meet FOTO goal Baseline:  Goal status: INITIAL  2.  AROM cervical rotation to 60 deg bil without discomfort Baseline:  Goal status: INITIAL  3.  Able to participate in activities without limitation by spasm Baseline:  Goal status: INITIAL  4.  Bil GHJ ER to 5/5 Baseline:  Goal status: INITIAL   PLAN:  PT FREQUENCY: 1-2x/week  PT DURATION: 10 weeks  PLANNED INTERVENTIONS: Therapeutic exercises, Therapeutic activity, Neuromuscular re-education, Patient/Family education, Self Care, Joint mobilization, Stair training, Aquatic Therapy, Dry Needling, Electrical stimulation, Spinal mobilization, Cryotherapy, Moist heat, Taping, Ionotophoresis 4mg /ml Dexamethasone, Manual therapy, and Re-evaluation.  PLAN FOR NEXT SESSION: DN PRN, deep neck flexor & periscap strengthening.     Reola Mosher Merelin Human, PT 11/13/2022, 7:18 AM

## 2022-11-17 ENCOUNTER — Encounter (HOSPITAL_BASED_OUTPATIENT_CLINIC_OR_DEPARTMENT_OTHER): Payer: Self-pay | Admitting: Physical Therapy

## 2022-11-17 ENCOUNTER — Ambulatory Visit (HOSPITAL_BASED_OUTPATIENT_CLINIC_OR_DEPARTMENT_OTHER): Payer: HMO | Admitting: Physical Therapy

## 2022-11-17 DIAGNOSIS — M542 Cervicalgia: Secondary | ICD-10-CM

## 2022-11-17 DIAGNOSIS — M79601 Pain in right arm: Secondary | ICD-10-CM | POA: Diagnosis not present

## 2022-11-17 DIAGNOSIS — Z978 Presence of other specified devices: Secondary | ICD-10-CM | POA: Diagnosis not present

## 2022-11-17 DIAGNOSIS — M62830 Muscle spasm of back: Secondary | ICD-10-CM

## 2022-11-17 DIAGNOSIS — M79631 Pain in right forearm: Secondary | ICD-10-CM | POA: Diagnosis not present

## 2022-11-17 NOTE — Therapy (Signed)
OUTPATIENT PHYSICAL THERAPY Treatment   Patient Name: Eric Cochran MRN: [id number] DOB:[birth year], 72 y.o., male Today's Date: 11/17/2022  END OF SESSION:  PT End of Session - 11/17/22 1314     Visit Number 4    Number of Visits 21    Date for PT Re-Evaluation 01/17/23    Authorization Type HTA    PT Start Time 1315    PT Stop Time 1356    PT Time Calculation (min) 41 min    Activity Tolerance Patient tolerated treatment well    Behavior During Therapy WFL for tasks assessed/performed             Past Medical History:  Diagnosis Date   Arthritis    Obesity (BMI 30-39.9)    Obstructive sleep apnea    severe- on VPAP auto    Perirectal abscess    Past Surgical History:  Procedure Laterality Date   ANKLE SURGERY     BACK SURGERY     HERNIA REPAIR     umb hernia   INCISION AND DRAINAGE PERIRECTAL ABSCESS  04/08/2011   Procedure: IRRIGATION AND DEBRIDEMENT PERIRECTAL ABSCESS;  Surgeon: Clovis Pu. Cornett, MD;  Location: [facility] OR;  Service: General;  Laterality: N/A;   LIGAMENT REPAIR     right arm ulna   NASAL SINUS SURGERY     Patient Active Problem List   Diagnosis Date Noted   Obstructive sleep apnea    Obesity (BMI 30-39.9)      REFERRING PROVIDER:  Dawley, Alan Mulder, DO    REFERRING DIAG: M54.2 (ICD-10-CM) - Cervicalgia   Dry Needling  Rationale for Evaluation and Treatment: Rehabilitation  THERAPY DIAG:  Cervicalgia  Muscle spasm of back  ONSET DATE: subacute on chronic   SUBJECTIVE:                                                                                                                                                                                           SUBJECTIVE STATEMENT: Been doing exercises and being aware of posture. I feel the stretch in sidebend. Being evaluated for issues with plate in right ulna, shooting pain and random cramping inRt lateral hand.   PERTINENT HISTORY:  L5 S1 fusion bony, L3-4 plate fusion  PAIN:   Are you having pain? Yes: NPRS scale: 2/10 Pain location: tightness in bil upper traps Pain description: spasms, general discomfort Aggravating factors:   Relieving factors: ice  PRECAUTIONS:  None  RED FLAGS: None   WEIGHT BEARING RESTRICTIONS:  No  FALLS:  Has patient fallen in last 6 months? 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  DIAGNOSTIC FINDINGS:  No recent imaging available  PATIENT SURVEYS:  FOTO 50  POSTURE:  forward head, decreased lumbar lordosis, and decreased thoracic kyphosis  HAND DOMINANCE:  Right     Body Part #1 Cervical  PALPATION: Eval: Passive range of motion greater than active range of motion, soft stretchy end feel  CERVICAL ROM:   Active ROM A/PROM (deg) eval 11/13/22 10/14  Flexion 18 35   Extension 22 35   Right lateral flexion 14 20   Left lateral flexion 18 18   Right rotation 50 52 62 after manual  Left rotation 52 55 62 after manual   (Blank rows = not tested)   TREATMENT:                                                                                                                              Treatment                            10/14:  Trigger Point Dry Needling, Manual Therapy Treatment:  Initial or subsequent education regarding Trigger Point Dry Needling: Subsequent Did patient give consent to treatment with Trigger Point Dry Needling: Yes TPDN with skilled palpation and monitoring followed by STM to the following muscles: Lt upper traps, Lt levator scap, bilat suboccipitals.   Distraction+cervical rotation Chin tuck+lift Supine cervical rotation Bilat shoulder extension with weight bar- supinated Overhead press against wall weighted bar Rhomboid stretch pull from door frame   11/13/22 Manual:  STM to cervical paraspinals, Grade II-III R and L UPA C3-C7, manual traction Seated cervical retraction 2 x 10  Standing bilateral ER GTB 2 x 10 Standing shoulder PNF D2 pattern GTB 2 x 10 Seated UT stretch 3 x 20 second holds   Treatment                            10/7:  Trigger Point Dry Needling, Manual Therapy Treatment:  Initial or subsequent education regarding Trigger Point Dry Needling: Subsequent Did patient give consent to treatment with Trigger Point Dry Needling: Yes TPDN with skilled palpation and monitoring followed by STM to the following muscles: Lt upper traps, bilat C4 paraspinals  Prone Lt rib mobilizaitons, first rib depression, STM Lt levator scap Supine cervical distraction + suboccipital release; also added chin tuck to traction Supine chin tuck+head lift Max hold 6s today Prone scapular retraction, retraction +UE extension- delay in motion of Lt scapula vs Rt notable today Row blue tband Lt UE D1 ext red tband Hooklying abd set + horiz abd blue tband   DATE: Eval 9/30  Trigger Point Dry Needling, Manual Therapy Treatment:  Initial or subsequent education regarding  Trigger Point Dry Needling: Initial - education time included in Manual Did patient give consent to treatment with Trigger Point Dry Needling: Yes TPDN with skilled palpation and monitoring followed by STM to the following muscles: bilat upper traps, suboccipitals, C3-4 Rt paraspinals  Supine chin tuck Supine pec stretch   PATIENT EDUCATION:  Education details: Anatomy of condition, POC, HEP, exercise form/rationale Person educated: Patient and Spouse Education method: Explanation, Demonstration, Tactile cues, Verbal cues, and Handouts Education comprehension: verbalized understanding, returned demonstration, verbal cues required, tactile cues required, and needs further education  HOME EXERCISE PROGRAM: [id number]   ASSESSMENT:  CLINICAL IMPRESSION: Significant twitch response with  increased ease of motion. Continues to be limited in left rib cage mobility  but is improving. Verbalized soreness as expected following TPDN. Distal end of ulna plate is TTP but unable to recreate symptoms from the cervical region.      REHAB POTENTIAL: Good  CLINICAL DECISION MAKING: Stable/uncomplicated  EVALUATION COMPLEXITY: Low   GOALS: Goals reviewed with patient? Yes  SHORT TERM GOALS: Target date: 10/26  Able to demo proper resting posture with awareness of correction of forward head.  Baseline: Goal status: INITIAL    LONG TERM GOALS: Target date: POC date  Meet FOTO goal Baseline:  Goal status: INITIAL  2.  AROM cervical rotation to 60 deg bil without discomfort Baseline:  Goal status: INITIAL  3.  Able to participate in activities without limitation by spasm Baseline:  Goal status: INITIAL  4.  Bil GHJ ER to 5/5 Baseline:  Goal status: INITIAL   PLAN:  PT FREQUENCY: 1-2x/week  PT DURATION: 10 weeks  PLANNED INTERVENTIONS: Therapeutic exercises, Therapeutic activity, Neuromuscular re-education, Patient/Family education, Self Care, Joint mobilization, Stair training, Aquatic Therapy, Dry Needling, Electrical stimulation, Spinal mobilization, Cryotherapy, Moist heat, Taping, Ionotophoresis 4mg /ml Dexamethasone, Manual therapy, and Re-evaluation.  PLAN FOR NEXT SESSION: DN PRN, deep neck flexor & periscap strengthening.    Asya Derryberry C. Osker Ayoub PT, DPT 11/17/22 1:59 PM

## 2022-11-19 DIAGNOSIS — M4802 Spinal stenosis, cervical region: Secondary | ICD-10-CM | POA: Diagnosis not present

## 2022-11-19 DIAGNOSIS — M5416 Radiculopathy, lumbar region: Secondary | ICD-10-CM | POA: Diagnosis not present

## 2022-11-19 DIAGNOSIS — Z6832 Body mass index (BMI) 32.0-32.9, adult: Secondary | ICD-10-CM | POA: Diagnosis not present

## 2022-11-20 ENCOUNTER — Ambulatory Visit (HOSPITAL_BASED_OUTPATIENT_CLINIC_OR_DEPARTMENT_OTHER): Payer: HMO | Admitting: Physical Therapy

## 2022-11-20 ENCOUNTER — Encounter (HOSPITAL_BASED_OUTPATIENT_CLINIC_OR_DEPARTMENT_OTHER): Payer: Self-pay | Admitting: Physical Therapy

## 2022-11-20 DIAGNOSIS — M542 Cervicalgia: Secondary | ICD-10-CM

## 2022-11-20 DIAGNOSIS — M62830 Muscle spasm of back: Secondary | ICD-10-CM

## 2022-11-20 NOTE — Therapy (Signed)
OUTPATIENT PHYSICAL THERAPY Treatment   Patient Name: Eric Cochran MRN: [id number] DOB:05/26/1950, 72 y.o., male Today's Date: 11/20/2022  END OF SESSION:  PT End of Session - 11/20/22 0717     Visit Number 5    Number of Visits 21    Date for PT Re-Evaluation 01/17/23    Authorization Type HTA    PT Start Time 0717    PT Stop Time 0757    PT Time Calculation (min) 40 min    Activity Tolerance Patient tolerated treatment well    Behavior During Therapy [facility] for tasks assessed/performed             Past Medical History:  Diagnosis Date   Arthritis    Obesity (BMI 30-39.9)    Obstructive sleep apnea    severe- on VPAP auto    Perirectal abscess    Past Surgical History:  Procedure Laterality Date   ANKLE SURGERY     BACK SURGERY     HERNIA REPAIR     umb hernia   INCISION AND DRAINAGE PERIRECTAL ABSCESS  04/08/2011   Procedure: IRRIGATION AND DEBRIDEMENT PERIRECTAL ABSCESS;  Surgeon: Clovis Pu. Cornett, MD;  Location: [facility] OR;  Service: General;  Laterality: N/A;   LIGAMENT REPAIR     right arm ulna   NASAL SINUS SURGERY     Patient Active Problem List   Diagnosis Date Noted   Obstructive sleep apnea    Obesity (BMI 30-39.9)      REFERRING PROVIDER:  Dawley, Alan Mulder, DO    REFERRING DIAG: M54.2 (ICD-10-CM) - Cervicalgia   Dry Needling  Rationale for Evaluation and Treatment: Rehabilitation  THERAPY DIAG:  Cervicalgia  Muscle spasm of back  ONSET DATE: subacute on chronic   SUBJECTIVE:                                                                                                                                                                                           SUBJECTIVE STATEMENT: Some soreness with new exercises. Some narrowing of spaces and bone growth on MRI. Going to continue PT and then join [employer]. Felt good after DN. Some back pain and muscle cramping.   PERTINENT HISTORY:  L5 S1 fusion bony, L3-4 plate fusion  PAIN:  Are  you having pain? Yes: NPRS scale: 2/10 Pain location: tightness in bil upper traps Pain description: spasms, general discomfort Aggravating factors:   Relieving factors: ice  PRECAUTIONS:  None  RED FLAGS: None   WEIGHT BEARING RESTRICTIONS:  No  FALLS:  Has patient fallen in last 6 months? 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  DIAGNOSTIC FINDINGS:  No recent imaging available  PATIENT SURVEYS:  FOTO 50  POSTURE:  forward head, decreased lumbar lordosis, and decreased thoracic kyphosis  HAND DOMINANCE:  Right     Body Part #1 Cervical  PALPATION: Eval: Passive range of motion greater than active range of motion, soft stretchy end feel  CERVICAL ROM:   Active ROM A/PROM (deg) eval 11/13/22 10/14 11/20/22  Flexion 18 35  41  Extension 22 35  42  Right lateral flexion 14 20  24  improves to 25  Left lateral flexion 18 18  19  improves to 25  Right rotation 50 52 62 after manual 64 improved to 73  Left rotation 52 55 62 after manual 70 improved to 73   (Blank rows = not tested)   TREATMENT:                                                                                                                              11/20/22 Manual: STM to cervical paraspinals, Grade II-III R and L UPA C3-C7, manual traction, L lateral glide C3-C7, UT stretch Manual: STM to R upper cervical paraspinals and L UT pre and post dry needling for trigger point identification and muscular relaxation. Trigger Point Dry-Needling  Treatment instructions: Expect mild to moderate muscle soreness. S/S of pneumothorax if dry needled over a lung field, and to seek immediate medical attention should they occur. Patient verbalized understanding of these instructions and  education.  Patient Consent Given: Yes Education handout provided: Previously provided Muscles treated:  R upper cervical paraspinals and L UT  Electrical stimulation performed: No Parameters: N/A Treatment response/outcome: twitch response at both, decrease in tissue tension, increase in ROM  Seated UT stretch 3 x 20 second holds   Treatment                            10/14:  Trigger Point Dry Needling, Manual Therapy Treatment:  Initial or subsequent education regarding Trigger Point Dry Needling: Subsequent Did patient give consent to treatment with Trigger Point Dry Needling: Yes TPDN with skilled palpation and monitoring followed by STM to the following muscles: Lt upper traps, Lt levator scap, bilat suboccipitals.   Distraction+cervical rotation Chin tuck+lift Supine cervical rotation Bilat shoulder extension with weight bar- supinated Overhead press against wall weighted bar Rhomboid stretch pull from door frame   11/13/22 Manual: STM to cervical paraspinals, Grade II-III R and L UPA C3-C7, manual traction Seated cervical retraction 2 x 10  Standing bilateral ER GTB 2 x 10 Standing shoulder PNF D2 pattern GTB 2 x 10 Seated UT stretch 3 x 20 second holds   Treatment  10/7:  Trigger Point Dry Needling, Manual Therapy Treatment:  Initial or subsequent education regarding Trigger Point Dry Needling: Subsequent Did patient give consent to treatment with Trigger Point Dry Needling: Yes TPDN with skilled palpation and monitoring followed by STM to the following muscles: Lt upper traps, bilat C4 paraspinals  Prone Lt rib mobilizaitons, first rib depression, STM Lt levator scap Supine cervical distraction + suboccipital release; also added chin tuck to traction Supine chin tuck+head lift Max hold 6s today Prone scapular retraction, retraction +UE extension- delay in motion of Lt scapula vs Rt notable today Row blue tband Lt UE D1 ext red  tband Hooklying abd set + horiz abd blue tband   DATE: Eval 9/30  Trigger Point Dry Needling, Manual Therapy Treatment:  Initial or subsequent education regarding Trigger Point Dry Needling: Initial - education time included in Manual Did patient give consent to treatment with Trigger Point Dry Needling: Yes TPDN with skilled palpation and monitoring followed by STM to the following muscles: bilat upper traps, suboccipitals, C3-4 Rt paraspinals  Supine chin tuck Supine pec stretch   PATIENT EDUCATION:  Education details: Teacher, music of condition, POC, HEP, exercise form/rationale Person educated: Patient and Spouse Education method: Explanation, Demonstration, Tactile cues, Verbal cues, and Handouts Education comprehension: verbalized understanding, returned demonstration, verbal cues required, tactile cues required, and needs further education  HOME EXERCISE PROGRAM: [id number]   ASSESSMENT:  CLINICAL IMPRESSION: Patient with improving cervical ROM from initial eval and prior sessions. Continued with manual and DN with improved ROM following. Great twitch responses in UT and paraspinals with improvement in symptoms. Patient will continue to benefit from physical therapy in order to improve function and reduce impairment.      REHAB POTENTIAL: Good  CLINICAL DECISION MAKING: Stable/uncomplicated  EVALUATION COMPLEXITY: Low   GOALS: Goals reviewed with patient? Yes  SHORT TERM GOALS: Target date: 10/26  Able to demo proper resting posture with awareness of correction of forward head.  Baseline: Goal status: INITIAL    LONG TERM GOALS: Target date: POC date  Meet FOTO goal Baseline:  Goal status: INITIAL  2.  AROM cervical rotation to 60 deg bil without discomfort Baseline:  Goal status: INITIAL  3.  Able to participate in activities without limitation by spasm Baseline:  Goal status: INITIAL  4.  Bil GHJ ER to 5/5 Baseline:  Goal status:  INITIAL   PLAN:  PT FREQUENCY: 1-2x/week  PT DURATION: 10 weeks  PLANNED INTERVENTIONS: Therapeutic exercises, Therapeutic activity, Neuromuscular re-education, Patient/Family education, Self Care, Joint mobilization, Stair training, Aquatic Therapy, Dry Needling, Electrical stimulation, Spinal mobilization, Cryotherapy, Moist heat, Taping, Ionotophoresis 4mg /ml Dexamethasone, Manual therapy, and Re-evaluation.  PLAN FOR NEXT SESSION: DN PRN, deep neck flexor & periscap strengthening.     Reola Mosher Yeraldin Litzenberger, PT 11/20/2022, 7:17 AM

## 2022-11-24 ENCOUNTER — Encounter (HOSPITAL_BASED_OUTPATIENT_CLINIC_OR_DEPARTMENT_OTHER): Payer: Self-pay | Admitting: Physical Therapy

## 2022-11-24 ENCOUNTER — Ambulatory Visit (HOSPITAL_BASED_OUTPATIENT_CLINIC_OR_DEPARTMENT_OTHER): Payer: HMO | Admitting: Physical Therapy

## 2022-11-24 DIAGNOSIS — M542 Cervicalgia: Secondary | ICD-10-CM

## 2022-11-24 DIAGNOSIS — M62830 Muscle spasm of back: Secondary | ICD-10-CM

## 2022-11-24 NOTE — Therapy (Signed)
OUTPATIENT PHYSICAL THERAPY Treatment   Patient Name: Eric Cochran MRN: [id number] DOB:Oct 30, 1950, 72 y.o., male Today's Date: 11/24/2022  END OF SESSION:  PT End of Session - 11/24/22 1357     Visit Number 6    Number of Visits 21    Date for PT Re-Evaluation 01/17/23    Authorization Type HTA    PT Start Time 1357    PT Stop Time 1440    PT Time Calculation (min) 43 min    Activity Tolerance Patient tolerated treatment well    Behavior During Therapy WFL for tasks assessed/performed             Past Medical History:  Diagnosis Date   Arthritis    Obesity (BMI 30-39.9)    Obstructive sleep apnea    severe- on VPAP auto    Perirectal abscess    Past Surgical History:  Procedure Laterality Date   ANKLE SURGERY     BACK SURGERY     HERNIA REPAIR     umb hernia   INCISION AND DRAINAGE PERIRECTAL ABSCESS  04/08/2011   Procedure: IRRIGATION AND DEBRIDEMENT PERIRECTAL ABSCESS;  Surgeon: Clovis Pu. Cornett, MD;  Location: [facility] OR;  Service: General;  Laterality: N/A;   LIGAMENT REPAIR     right arm ulna   NASAL SINUS SURGERY     Patient Active Problem List   Diagnosis Date Noted   Obstructive sleep apnea    Obesity (BMI 30-39.9)      REFERRING PROVIDER:  Dawley, Alan Mulder, DO    REFERRING DIAG: M54.2 (ICD-10-CM) - Cervicalgia   Dry Needling  Rationale for Evaluation and Treatment: Rehabilitation  THERAPY DIAG:  Cervicalgia  Muscle spasm of back  ONSET DATE: subacute on chronic   SUBJECTIVE:                                                                                                                                                                                           SUBJECTIVE STATEMENT: ROM has gotten significantly better. MRI results from MD: right above second fusion has narrowed space, in neck- a couple areas where spaces are narrow but far away from surgery candidate.   PERTINENT HISTORY:  L5 S1 fusion bony, L3-4 plate fusion  PAIN:   Are you having pain? Yes: NPRS scale: 2/10 Pain location: tightness in bil upper traps Pain description: spasms, general discomfort Aggravating factors: looking up Relieving factors: ice  PRECAUTIONS:  None  RED FLAGS: None   WEIGHT BEARING RESTRICTIONS:  No  FALLS:  Has patient fallen in last 6 months? 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  DIAGNOSTIC FINDINGS:  No recent imaging available  PATIENT SURVEYS:  FOTO 50  POSTURE:  forward head, decreased lumbar lordosis, and decreased thoracic kyphosis  HAND DOMINANCE:  Right     Body Part #1 Cervical  PALPATION: Eval: Passive range of motion greater than active range of motion, soft stretchy end feel  CERVICAL ROM:   Active ROM A/PROM (deg) eval 11/13/22 10/14 11/20/22  Flexion 18 35  41  Extension 22 35  42  Right lateral flexion 14 20  24  improves to 25  Left lateral flexion 18 18  19  improves to 25  Right rotation 50 52 62 after manual 64 improved to 73  Left rotation 52 55 62 after manual 70 improved to 73   (Blank rows = not tested)   TREATMENT:                                                                                                                              Treatment                            11/24/22:  Trigger Point Dry Needling, Manual Therapy Treatment:  Initial or subsequent education regarding Trigger Point Dry Needling: Subsequent Did patient give consent to treatment with Trigger Point Dry Needling: Yes TPDN with skilled palpation and monitoring followed by STM to the following muscles: Lt upper trap, levator, rhomboid  Prone retraction, + GHJ ext Rt sidelying resisted scap protraction/retraction Sidelying Lt ER  Sidelying Lt scapular mobilizations &  distraction Supine cervical distraction, bil first rib mobilization, suboccipital release, C0 on C1 AP mobs Wall push up plus [employer] yellow tband-1 knee on table, ipsilat foot off table and on floor   11/20/22 Manual: STM to cervical paraspinals, Grade II-III R and L UPA C3-C7, manual traction, L lateral glide C3-C7, UT stretch Manual: STM to R upper cervical paraspinals and L UT pre and post dry needling for trigger point identification and muscular relaxation. Trigger Point Dry-Needling  Treatment instructions: Expect mild to moderate muscle soreness. S/S of pneumothorax if dry needled over a lung field, and to seek immediate medical attention should they occur. Patient verbalized understanding of these instructions and education.  Patient Consent Given: Yes Education handout provided: Previously provided Muscles treated:  R upper cervical paraspinals and L UT  Electrical stimulation performed: No Parameters: N/A Treatment response/outcome: twitch response at both, decrease in tissue tension, increase in ROM  Seated UT stretch 3 x 20 second holds   Treatment                            10/14:  Trigger Point Dry Needling, Manual Therapy Treatment:  Initial or subsequent education regarding Trigger Point Dry Needling: Subsequent Did patient give consent to treatment with Trigger Point Dry Needling: Yes TPDN with skilled palpation and monitoring followed by STM to the following muscles: Lt upper traps, Lt levator scap, bilat suboccipitals.   Distraction+cervical rotation Chin tuck+lift Supine cervical rotation Bilat shoulder extension with weight bar- supinated Overhead press against wall weighted bar Rhomboid stretch pull from door frame   11/13/22 Manual: STM to cervical paraspinals, Grade II-III R and L UPA C3-C7, manual traction Seated cervical retraction 2 x 10  Standing bilateral ER GTB 2 x 10 Standing shoulder PNF D2 pattern GTB 2 x 10 Seated UT stretch 3 x 20 second  holds   PATIENT EDUCATION:  Education details: Anatomy of condition, POC, HEP, exercise form/rationale Person educated: Patient and Spouse Education method: Explanation, Demonstration, Tactile cues, Verbal cues, and Handouts Education comprehension: verbalized understanding, returned demonstration, verbal cues required, tactile cues required, and needs further education  HOME EXERCISE PROGRAM: [id number]   ASSESSMENT:  CLINICAL IMPRESSION: Improved scapular mobility following treatment today and improved thoracic mobility in Lt cervical rotation. Lawn mower with ipsilateral leg extended to engage obliques for lumbopelvic stability.      REHAB POTENTIAL: Good  CLINICAL DECISION MAKING: Stable/uncomplicated  EVALUATION COMPLEXITY: Low   GOALS: Goals reviewed with patient? Yes  SHORT TERM GOALS: Target date: 10/26  Able to demo proper resting posture with awareness of correction of forward head.  Baseline: Goal status: achieved 10/21    LONG TERM GOALS: Target date: POC date  Meet FOTO goal Baseline:  Goal status: INITIAL  2.  AROM cervical rotation to 60 deg bil without discomfort Baseline:  Goal status: INITIAL  3.  Able to participate in activities without limitation by spasm Baseline:  Goal status: INITIAL  4.  Bil GHJ ER to 5/5 Baseline:  Goal status: INITIAL   PLAN:  PT FREQUENCY: 1-2x/week  PT DURATION: 10 weeks  PLANNED INTERVENTIONS: Therapeutic exercises, Therapeutic activity, Neuromuscular re-education, Patient/Family education, Self Care, Joint mobilization, Stair training, Aquatic Therapy, Dry Needling, Electrical stimulation, Spinal mobilization, Cryotherapy, Moist heat, Taping, Ionotophoresis 4mg /ml Dexamethasone, Manual therapy, and Re-evaluation.  PLAN FOR NEXT SESSION: DN PRN, deep neck flexor & periscap strengthening.   Ezekial Arns C. Ashling Roane PT, DPT 11/24/22 2:42 PM

## 2022-11-27 ENCOUNTER — Ambulatory Visit (HOSPITAL_BASED_OUTPATIENT_CLINIC_OR_DEPARTMENT_OTHER): Payer: HMO | Admitting: Physical Therapy

## 2022-11-27 ENCOUNTER — Encounter (HOSPITAL_BASED_OUTPATIENT_CLINIC_OR_DEPARTMENT_OTHER): Payer: Self-pay | Admitting: Physical Therapy

## 2022-11-27 DIAGNOSIS — M542 Cervicalgia: Secondary | ICD-10-CM | POA: Diagnosis not present

## 2022-11-27 DIAGNOSIS — M62830 Muscle spasm of back: Secondary | ICD-10-CM

## 2022-11-27 DIAGNOSIS — G4733 Obstructive sleep apnea (adult) (pediatric): Secondary | ICD-10-CM | POA: Diagnosis not present

## 2022-11-27 DIAGNOSIS — G4730 Sleep apnea, unspecified: Secondary | ICD-10-CM | POA: Diagnosis not present

## 2022-11-27 NOTE — Therapy (Signed)
OUTPATIENT PHYSICAL THERAPY Treatment   Patient Name: Eric Cochran MRN: [id number] DOB:07-13-1950, 72 y.o., male Today's Date: 11/27/2022  END OF SESSION:  PT End of Session - 11/27/22 1145     Visit Number 7    Number of Visits 21    Date for PT Re-Evaluation 01/17/23    Authorization Type HTA    PT Start Time 1145    PT Stop Time 1230    PT Time Calculation (min) 45 min    Activity Tolerance Patient tolerated treatment well    Behavior During Therapy WFL for tasks assessed/performed             Past Medical History:  Diagnosis Date   Arthritis    Obesity (BMI 30-39.9)    Obstructive sleep apnea    severe- on VPAP auto    Perirectal abscess    Past Surgical History:  Procedure Laterality Date   ANKLE SURGERY     BACK SURGERY     HERNIA REPAIR     umb hernia   INCISION AND DRAINAGE PERIRECTAL ABSCESS  04/08/2011   Procedure: IRRIGATION AND DEBRIDEMENT PERIRECTAL ABSCESS;  Surgeon: Clovis Pu. Cornett, MD;  Location: [facility] OR;  Service: General;  Laterality: N/A;   LIGAMENT REPAIR     right arm ulna   NASAL SINUS SURGERY     Patient Active Problem List   Diagnosis Date Noted   Obstructive sleep apnea    Obesity (BMI 30-39.9)      REFERRING PROVIDER:  Dawley, Alan Mulder, DO    REFERRING DIAG: M54.2 (ICD-10-CM) - Cervicalgia   Dry Needling  Rationale for Evaluation and Treatment: Rehabilitation  THERAPY DIAG:  Cervicalgia  Muscle spasm of back  ONSET DATE: subacute on chronic   SUBJECTIVE:                                                                                                                                                                                           SUBJECTIVE STATEMENT: Patient states cramping in low/mid back region. ROM better, stiff with side bending.   PERTINENT HISTORY:  L5 S1 fusion bony, L3-4 plate fusion  PAIN:  Are you having pain? Yes: NPRS scale: 2/10 Pain location: tightness in bil upper traps Pain  description: spasms, general discomfort Aggravating factors: looking up Relieving factors: ice  PRECAUTIONS:  None  RED FLAGS: None   WEIGHT BEARING RESTRICTIONS:  No  FALLS:  Has patient fallen in last 6 months? 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  DIAGNOSTIC FINDINGS:  No recent imaging available  PATIENT SURVEYS:  FOTO 50  POSTURE:  forward head, decreased lumbar lordosis, and decreased thoracic kyphosis  HAND DOMINANCE:  Right     Body Part #1 Cervical  PALPATION: Eval: Passive range of motion greater than active range of motion, soft stretchy end feel  CERVICAL ROM:   Active ROM A/PROM (deg) eval 11/13/22 10/14 11/20/22  Flexion 18 35  41  Extension 22 35  42  Right lateral flexion 14 20  24  improves to 25  Left lateral flexion 18 18  19  improves to 25  Right rotation 50 52 62 after manual 64 improved to 73  Left rotation 52 55 62 after manual 70 improved to 73   (Blank rows = not tested)   TREATMENT:                                                                                                                              11/27/22 Manual: STM to cervical paraspinals, Grade II-III R and L UPA C3-C7, manual traction, L lateral glide C3-C7, UT stretch Prone shoulder extension 3# 2 x 15 Prone shoulder horizontal abduction 3# 2 x 10  Open book short lever arm shoulder 1 x 10 with 5 seconds bilateral    Treatment                            11/24/22:  Trigger Point Dry Needling, Manual Therapy Treatment:  Initial or subsequent education regarding Trigger Point Dry Needling: Subsequent Did patient give consent to treatment with Trigger Point Dry Needling: Yes TPDN with skilled palpation and monitoring followed by STM to the following  muscles: Lt upper trap, levator, rhomboid  Prone retraction, + GHJ ext Rt sidelying resisted scap protraction/retraction Sidelying Lt ER  Sidelying Lt scapular mobilizations & distraction Supine cervical distraction, bil first rib mobilization, suboccipital release, C0 on C1 AP mobs Wall push up plus [employer] yellow tband-1 knee on table, ipsilat foot off table and on floor   11/20/22 Manual: STM to cervical paraspinals, Grade II-III R and L UPA C3-C7, manual traction, L lateral glide C3-C7, UT stretch Manual: STM to R upper cervical paraspinals and L UT pre and post dry needling for trigger point identification and muscular relaxation. Trigger Point Dry-Needling  Treatment instructions: Expect mild to moderate muscle soreness. S/S of pneumothorax if dry needled over a lung field, and to seek immediate medical attention should they occur. Patient verbalized understanding of these instructions and education.  Patient Consent Given: Yes Education handout provided: Previously provided Muscles treated:  R upper cervical paraspinals and L UT  Electrical stimulation performed: No Parameters: N/A Treatment response/outcome: twitch response at both, decrease in tissue tension, increase in ROM  Seated UT stretch 3 x 20 second holds   Treatment  10/14:  Trigger Point Dry Needling, Manual Therapy Treatment:  Initial or subsequent education regarding Trigger Point Dry Needling: Subsequent Did patient give consent to treatment with Trigger Point Dry Needling: Yes TPDN with skilled palpation and monitoring followed by STM to the following muscles: Lt upper traps, Lt levator scap, bilat suboccipitals.   Distraction+cervical rotation Chin tuck+lift Supine cervical rotation Bilat shoulder extension with weight bar- supinated Overhead press against wall weighted bar Rhomboid stretch pull from door frame   11/13/22 Manual: STM to cervical paraspinals, Grade II-III R  and L UPA C3-C7, manual traction Seated cervical retraction 2 x 10  Standing bilateral ER GTB 2 x 10 Standing shoulder PNF D2 pattern GTB 2 x 10 Seated UT stretch 3 x 20 second holds   PATIENT EDUCATION:  Education details: Anatomy of condition, POC, HEP, exercise form/rationale Person educated: Patient and Spouse Education method: Explanation, Demonstration, Tactile cues, Verbal cues, and Handouts Education comprehension: verbalized understanding, returned demonstration, verbal cues required, tactile cues required, and needs further education  HOME EXERCISE PROGRAM: [id number]   ASSESSMENT:  CLINICAL IMPRESSION: Continued with manual for spinal mobility and ROM. Began additional periscapular and spinal mobility exercises performed with good mechanics. Shoulder fatigue following. Patient will continue to benefit from physical therapy in order to improve function and reduce impairment.      REHAB POTENTIAL: Good  CLINICAL DECISION MAKING: Stable/uncomplicated  EVALUATION COMPLEXITY: Low   GOALS: Goals reviewed with patient? Yes  SHORT TERM GOALS: Target date: 10/26  Able to demo proper resting posture with awareness of correction of forward head.  Baseline: Goal status: achieved 10/21    LONG TERM GOALS: Target date: POC date  Meet FOTO goal Baseline:  Goal status: INITIAL  2.  AROM cervical rotation to 60 deg bil without discomfort Baseline:  Goal status: INITIAL  3.  Able to participate in activities without limitation by spasm Baseline:  Goal status: INITIAL  4.  Bil GHJ ER to 5/5 Baseline:  Goal status: INITIAL   PLAN:  PT FREQUENCY: 1-2x/week  PT DURATION: 10 weeks  PLANNED INTERVENTIONS: Therapeutic exercises, Therapeutic activity, Neuromuscular re-education, Patient/Family education, Self Care, Joint mobilization, Stair training, Aquatic Therapy, Dry Needling, Electrical stimulation, Spinal mobilization, Cryotherapy, Moist heat, Taping,  Ionotophoresis 4mg /ml Dexamethasone, Manual therapy, and Re-evaluation.  PLAN FOR NEXT SESSION: DN PRN, deep neck flexor & periscap strengthening.    Reola Mosher Alfrieda Tarry, PT 11/27/2022, 12:38 PM

## 2022-12-01 ENCOUNTER — Encounter (HOSPITAL_BASED_OUTPATIENT_CLINIC_OR_DEPARTMENT_OTHER): Payer: Self-pay | Admitting: Physical Therapy

## 2022-12-01 ENCOUNTER — Ambulatory Visit (HOSPITAL_BASED_OUTPATIENT_CLINIC_OR_DEPARTMENT_OTHER): Payer: HMO | Admitting: Physical Therapy

## 2022-12-01 DIAGNOSIS — M542 Cervicalgia: Secondary | ICD-10-CM

## 2022-12-01 DIAGNOSIS — M62830 Muscle spasm of back: Secondary | ICD-10-CM

## 2022-12-01 NOTE — Therapy (Signed)
OUTPATIENT PHYSICAL THERAPY Treatment   Patient Name: Eric Cochran MRN: [id number] DOB:December 05, 1950, 72 y.o., male Today's Date: 12/01/2022  END OF SESSION:  PT End of Session - 12/01/22 0852     Visit Number 8    Number of Visits 21    Date for PT Re-Evaluation 01/17/23    Authorization Type HTA    PT Start Time 0847    PT Stop Time 0925    PT Time Calculation (min) 38 min    Activity Tolerance Patient tolerated treatment well    Behavior During Therapy [facility] for tasks assessed/performed              Past Medical History:  Diagnosis Date   Arthritis    Obesity (BMI 30-39.9)    Obstructive sleep apnea    severe- on VPAP auto    Perirectal abscess    Past Surgical History:  Procedure Laterality Date   ANKLE SURGERY     BACK SURGERY     HERNIA REPAIR     umb hernia   INCISION AND DRAINAGE PERIRECTAL ABSCESS  04/08/2011   Procedure: IRRIGATION AND DEBRIDEMENT PERIRECTAL ABSCESS;  Surgeon: Clovis Pu. Cornett, MD;  Location: [facility] OR;  Service: General;  Laterality: N/A;   LIGAMENT REPAIR     right arm ulna   NASAL SINUS SURGERY     Patient Active Problem List   Diagnosis Date Noted   Obstructive sleep apnea    Obesity (BMI 30-39.9)      REFERRING PROVIDER:  Dawley, Troy C, DO    REFERRING DIAG: M54.2 (ICD-10-CM) - Cervicalgia   Dry Needling  Rationale for Evaluation and Treatment: Rehabilitation  THERAPY DIAG:  Cervicalgia  Muscle spasm of back  ONSET DATE: subacute on chronic   SUBJECTIVE:                                                                                                                                                                                           SUBJECTIVE STATEMENT: Sitting for long periods of times will still cause stiffness. Pt reports same tightness and pain into UT at this time.   PERTINENT HISTORY:  L5 S1 fusion bony, L3-4 plate fusion  PAIN:  Are you having pain? Yes: NPRS scale: 2/10 Pain location: tightness  in bil upper traps Pain description: spasms, general discomfort Aggravating factors: looking up Relieving factors: ice  PRECAUTIONS:  None  RED FLAGS: None   WEIGHT BEARING RESTRICTIONS:  No  FALLS:  Has patient fallen in last 6 months? 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  DIAGNOSTIC FINDINGS:  No recent imaging available  PATIENT SURVEYS:  FOTO 50  POSTURE:  forward head, decreased lumbar lordosis, and decreased thoracic kyphosis  HAND DOMINANCE:  Right     Body Part #1 Cervical  PALPATION: Eval: Passive range of motion greater than active range of motion, soft stretchy end feel  CERVICAL ROM:   Active ROM A/PROM (deg) eval 11/13/22 10/14 11/20/22  Flexion 18 35  41  Extension 22 35  42  Right lateral flexion 14 20  24  improves to 25  Left lateral flexion 18 18  19  improves to 25  Right rotation 50 52 62 after manual 64 improved to 73  Left rotation 52 55 62 after manual 70 improved to 73   (Blank rows = not tested)   TREATMENT:          10/28 STM L UT, bilat cervical paraspinals UPA grade III-IV bilat C3-6, inf medial glide grade IV same levels Mnaula traction Manual traction with chin tuck 2x10  Supine chin tuck with rotation 2x10 RTB shrug with head tilt (loaded eccentric, light) 2x10 SNAG cervical rotation with pillowcase 2x10 bilat                                                                                                                       11/27/22 Manual: STM to cervical paraspinals, Grade II-III R and L UPA C3-C7, manual traction, L lateral glide C3-C7, UT stretch Prone shoulder extension 3# 2 x 15 Prone shoulder horizontal abduction 3# 2 x 10  Open book short lever arm shoulder 1 x 10 with 5 seconds bilateral     Treatment                            11/24/22:  Trigger Point Dry Needling, Manual Therapy Treatment:  Initial or subsequent education regarding Trigger Point Dry Needling: Subsequent Did patient give consent to treatment with Trigger Point Dry Needling: Yes TPDN with skilled palpation and monitoring followed by STM to the following muscles: Lt upper trap, levator, rhomboid  Prone retraction, + GHJ ext Rt sidelying resisted scap protraction/retraction Sidelying Lt ER  Sidelying Lt scapular mobilizations & distraction Supine cervical distraction, bil first rib mobilization, suboccipital release, C0 on C1 AP mobs Wall push up plus [employer] yellow tband-1 knee on table, ipsilat foot off table and on floor   11/20/22 Manual: STM to cervical paraspinals, Grade II-III R and L UPA C3-C7, manual traction, L lateral glide C3-C7, UT stretch Manual: STM to R upper cervical paraspinals and L UT pre and post dry needling for trigger point identification and muscular relaxation. Trigger Point Dry-Needling  Treatment instructions: Expect mild to moderate muscle soreness. S/S of pneumothorax if dry needled over a lung field, and to seek immediate medical attention should they occur. Patient  verbalized understanding of these instructions and education.  Patient Consent Given: Yes Education handout provided: Previously provided Muscles treated:  R upper cervical paraspinals and L UT  Electrical stimulation performed: No Parameters: N/A Treatment response/outcome: twitch response at both, decrease in tissue tension, increase in ROM  Seated UT stretch 3 x 20 second holds   Treatment                            10/14:  Trigger Point Dry Needling, Manual Therapy Treatment:  Initial or subsequent education regarding Trigger Point Dry Needling: Subsequent Did patient give consent to treatment with Trigger Point Dry Needling: Yes TPDN with skilled palpation and monitoring followed by STM to the  following muscles: Lt upper traps, Lt levator scap, bilat suboccipitals.   Distraction+cervical rotation Chin tuck+lift Supine cervical rotation Bilat shoulder extension with weight bar- supinated Overhead press against wall weighted bar Rhomboid stretch pull from door frame   11/13/22 Manual: STM to cervical paraspinals, Grade II-III R and L UPA C3-C7, manual traction Seated cervical retraction 2 x 10  Standing bilateral ER GTB 2 x 10 Standing shoulder PNF D2 pattern GTB 2 x 10 Seated UT stretch 3 x 20 second holds   PATIENT EDUCATION:  Education details: Anatomy of condition, POC, HEP, exercise form/rationale Person educated: Patient and Spouse Education method: Programmer, multimedia, Demonstration, Tactile cues, Verbal cues, and Handouts Education comprehension: verbalized understanding, returned demonstration, verbal cues required, tactile cues required, and needs further education  HOME EXERCISE PROGRAM: [id number]   ASSESSMENT:  CLINICAL IMPRESSION: Pt with signficant improvement of cervical ROM following manual therapy and exercise to near full rotational (76 deg) ROM and report of relief of stiffness. Pt HEP updated with light eccentric of the UT to begin desensitization and loaded stretching as well as self cervical snag to address joint stiffness/mobility deficits. Plan to recheck for response to session at next and continue with manual and mobility and postural strength as tolerated. Patient will continue to benefit from physical therapy in order to improve function and reduce impairment.      REHAB POTENTIAL: Good  CLINICAL DECISION MAKING: Stable/uncomplicated  EVALUATION COMPLEXITY: Low   GOALS: Goals reviewed with patient? Yes  SHORT TERM GOALS: Target date: 10/26  Able to demo proper resting posture with awareness of correction of forward head.  Baseline: Goal status: achieved 10/21    LONG TERM GOALS: Target date: POC date  Meet FOTO goal Baseline:  Goal  status: INITIAL  2.  AROM cervical rotation to 60 deg bil without discomfort Baseline:  Goal status: INITIAL  3.  Able to participate in activities without limitation by spasm Baseline:  Goal status: INITIAL  4.  Bil GHJ ER to 5/5 Baseline:  Goal status: INITIAL   PLAN:  PT FREQUENCY: 1-2x/week  PT DURATION: 10 weeks  PLANNED INTERVENTIONS: Therapeutic exercises, Therapeutic activity, Neuromuscular re-education, Patient/Family education, Self Care, Joint mobilization, Stair training, Aquatic Therapy, Dry Needling, Electrical stimulation, Spinal mobilization, Cryotherapy, Moist heat, Taping, Ionotophoresis 4mg /ml Dexamethasone, Manual therapy, and Re-evaluation.  PLAN FOR NEXT SESSION: DN PRN, deep neck flexor & periscap strengthening.    Zebedee Iba, PT 12/01/2022, 9:34 AM

## 2022-12-04 ENCOUNTER — Encounter (HOSPITAL_BASED_OUTPATIENT_CLINIC_OR_DEPARTMENT_OTHER): Payer: HMO | Admitting: Physical Therapy

## 2022-12-08 ENCOUNTER — Encounter (HOSPITAL_BASED_OUTPATIENT_CLINIC_OR_DEPARTMENT_OTHER): Payer: Self-pay | Admitting: Physical Therapy

## 2022-12-08 ENCOUNTER — Ambulatory Visit (HOSPITAL_BASED_OUTPATIENT_CLINIC_OR_DEPARTMENT_OTHER): Payer: HMO | Attending: Neurological Surgery | Admitting: Physical Therapy

## 2022-12-08 DIAGNOSIS — M542 Cervicalgia: Secondary | ICD-10-CM | POA: Diagnosis not present

## 2022-12-08 DIAGNOSIS — M62830 Muscle spasm of back: Secondary | ICD-10-CM | POA: Diagnosis not present

## 2022-12-08 NOTE — Therapy (Signed)
OUTPATIENT PHYSICAL THERAPY Treatment   Patient Name: Eric Cochran MRN: [id number] DOB:01/01/1951, 72 y.o., male Today's Date: 12/08/2022  END OF SESSION:  PT End of Session - 12/08/22 1101     Visit Number 9    Number of Visits 21    Date for PT Re-Evaluation 01/17/23    Authorization Type HTA    PT Start Time 1101    PT Stop Time 1142    PT Time Calculation (min) 41 min    Activity Tolerance Patient tolerated treatment well    Behavior During Therapy WFL for tasks assessed/performed               Past Medical History:  Diagnosis Date   Arthritis    Obesity (BMI 30-39.9)    Obstructive sleep apnea    severe- on VPAP auto    Perirectal abscess    Past Surgical History:  Procedure Laterality Date   ANKLE SURGERY     BACK SURGERY     HERNIA REPAIR     umb hernia   INCISION AND DRAINAGE PERIRECTAL ABSCESS  04/08/2011   Procedure: IRRIGATION AND DEBRIDEMENT PERIRECTAL ABSCESS;  Surgeon: Clovis Pu. Cornett, MD;  Location: [facility] OR;  Service: General;  Laterality: N/A;   LIGAMENT REPAIR     right arm ulna   NASAL SINUS SURGERY     Patient Active Problem List   Diagnosis Date Noted   Obstructive sleep apnea    Obesity (BMI 30-39.9)      REFERRING PROVIDER:  Dawley, Alan Mulder, DO    REFERRING DIAG: M54.2 (ICD-10-CM) - Cervicalgia   Dry Needling  Rationale for Evaluation and Treatment: Rehabilitation  THERAPY DIAG:  Cervicalgia  Muscle spasm of back  ONSET DATE: subacute on chronic   SUBJECTIVE:                                                                                                                                                                                           SUBJECTIVE STATEMENT: My neck is doing really well, my back is getting worse. I can move my head really far without pain. Back feels muscular  PERTINENT HISTORY:  L5 S1 fusion bony, L3-4 plate fusion  PAIN:  Are you having pain? Yes: NPRS scale: 0/10 Pain location:  tightness in bil upper traps Pain description: spasms, general discomfort Aggravating factors: looking up Relieving factors: ice  PRECAUTIONS:  None  RED FLAGS: None   WEIGHT BEARING RESTRICTIONS:  No  FALLS:  Has patient fallen in last 6 months? 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  DIAGNOSTIC FINDINGS:  No recent imaging available  PATIENT SURVEYS:  FOTO 50  POSTURE:  forward head, decreased lumbar lordosis, and decreased thoracic kyphosis  HAND DOMINANCE:  Right     Body Part #1 Cervical  PALPATION: Eval: Passive range of motion greater than active range of motion, soft stretchy end feel  CERVICAL ROM:   Active ROM A/PROM (deg) eval 11/13/22 10/14 11/20/22 11/4  Flexion 18 35  41   Extension 22 35  42   Right lateral flexion 14 20  24  improves to 25 26  Left lateral flexion 18 18  19  improves to 25 26  Right rotation 50 52 62 after manual 64 improved to 73 66  Left rotation 52 55 62 after manual 70 improved to 73 68   (Blank rows = not tested)   TREATMENT:          Treatment                            11/4:  Trigger Point Dry Needling, Manual Therapy Treatment:  Initial or subsequent education regarding Trigger Point Dry Needling: Subsequent Did patient give consent to treatment with Trigger Point Dry Needling: Yes TPDN with skilled palpation and monitoring followed by STM to the following muscles: Rt glut max/med, Lt thoracic paraspinals followed by mid-thoracic rib mobs bilaterally  Seated piriformis stretch Standing squat deadlift with blue tband Seated hip hinge with bar- progressed to sit<>stand, to table taps Seated hamstring stretch Standing horizontal abduction lift from wall    10/28 STM L UT, bilat cervical  paraspinals UPA grade III-IV bilat C3-6, inf medial glide grade IV same levels Mnaula traction Manual traction with chin tuck 2x10  Supine chin tuck with rotation 2x10 RTB shrug with head tilt (loaded eccentric, light) 2x10 SNAG cervical rotation with pillowcase 2x10 bilat                                                                                                                       11/27/22 Manual: STM to cervical paraspinals, Grade II-III R and L UPA C3-C7, manual traction, L lateral glide C3-C7, UT stretch Prone shoulder extension 3# 2 x 15 Prone shoulder horizontal abduction 3# 2 x 10  Open book short lever arm shoulder 1 x 10 with 5 seconds bilateral    Treatment                            11/24/22:  Trigger Point Dry Needling, Manual Therapy Treatment:  Initial or subsequent education regarding Trigger Point Dry Needling: Subsequent Did patient give consent to treatment with Trigger Point Dry Needling: Yes TPDN with skilled palpation and monitoring followed by STM to the following muscles: Lt upper trap, levator, rhomboid  Prone  retraction, + GHJ ext Rt sidelying resisted scap protraction/retraction Sidelying Lt ER  Sidelying Lt scapular mobilizations & distraction Supine cervical distraction, bil first rib mobilization, suboccipital release, C0 on C1 AP mobs 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-1 knee on table, ipsilat foot off table and on floor     PATIENT EDUCATION:  Education details: Teacher, music of condition, POC, HEP, exercise form/rationale Person educated: Patient and Spouse Education method: Programmer, multimedia, Demonstration, Actor cues, Verbal cues, and Handouts Education comprehension: verbalized understanding, returned demonstration, verbal cues required, tactile cues required, and needs further education  HOME EXERCISE PROGRAM: [id number]   ASSESSMENT:  CLINICAL IMPRESSION: Significant twitch response noted in Rt glut med which allowed for thoracic  release and upright posture in seated. Able to maintain cervical ROM gained from previous visit and equal bilaterally. Full spinal involvement contributing to spasm and changes in cervical ROM.      REHAB POTENTIAL: Good  CLINICAL DECISION MAKING: Stable/uncomplicated  EVALUATION COMPLEXITY: Low   GOALS: Goals reviewed with patient? Yes  SHORT TERM GOALS: Target date: 10/26  Able to demo proper resting posture with awareness of correction of forward head.  Baseline: Goal status: achieved 10/21    LONG TERM GOALS: Target date: POC date  Meet FOTO goal Baseline:  Goal status: INITIAL  2.  AROM cervical rotation to 60 deg bil without discomfort Baseline:  Goal status: INITIAL  3.  Able to participate in activities without limitation by spasm Baseline:  Goal status: INITIAL  4.  Bil GHJ ER to 5/5 Baseline:  Goal status: INITIAL   PLAN:  PT FREQUENCY: 1-2x/week  PT DURATION: 10 weeks  PLANNED INTERVENTIONS: Therapeutic exercises, Therapeutic activity, Neuromuscular re-education, Patient/Family education, Self Care, Joint mobilization, Stair training, Aquatic Therapy, Dry Needling, Electrical stimulation, Spinal mobilization, Cryotherapy, Moist heat, Taping, Ionotophoresis 4mg /ml Dexamethasone, Manual therapy, and Re-evaluation.  PLAN FOR NEXT SESSION: DN PRN, deep neck flexor & periscap strengthening.    Gianlucas Evenson C. Kem Parcher PT, DPT 12/08/22 11:42 AM

## 2022-12-10 ENCOUNTER — Ambulatory Visit (HOSPITAL_BASED_OUTPATIENT_CLINIC_OR_DEPARTMENT_OTHER): Payer: HMO | Admitting: Physical Therapy

## 2022-12-10 ENCOUNTER — Encounter (HOSPITAL_BASED_OUTPATIENT_CLINIC_OR_DEPARTMENT_OTHER): Payer: Self-pay | Admitting: Physical Therapy

## 2022-12-10 DIAGNOSIS — M542 Cervicalgia: Secondary | ICD-10-CM

## 2022-12-10 DIAGNOSIS — M62830 Muscle spasm of back: Secondary | ICD-10-CM

## 2022-12-10 NOTE — Therapy (Signed)
OUTPATIENT PHYSICAL THERAPY Treatment   Patient Name: Eric Cochran MRN: [id number] DOB:30-Jul-1950, 72 y.o., male Today's Date: 12/10/2022  END OF SESSION:  PT End of Session - 12/10/22 1057     Visit Number 10    Number of Visits 21    Date for PT Re-Evaluation 01/17/23    Authorization Type HTA    PT Start Time 1026   late check in- PT issue   PT Stop Time 1057    PT Time Calculation (min) 31 min    Activity Tolerance Patient tolerated treatment well    Behavior During Therapy WFL for tasks assessed/performed                Past Medical History:  Diagnosis Date   Arthritis    Obesity (BMI 30-39.9)    Obstructive sleep apnea    severe- on VPAP auto    Perirectal abscess    Past Surgical History:  Procedure Laterality Date   ANKLE SURGERY     BACK SURGERY     HERNIA REPAIR     umb hernia   INCISION AND DRAINAGE PERIRECTAL ABSCESS  04/08/2011   Procedure: IRRIGATION AND DEBRIDEMENT PERIRECTAL ABSCESS;  Surgeon: Clovis Pu. Cornett, MD;  Location: [facility] OR;  Service: General;  Laterality: N/A;   LIGAMENT REPAIR     right arm ulna   NASAL SINUS SURGERY     Patient Active Problem List   Diagnosis Date Noted   Obstructive sleep apnea    Obesity (BMI 30-39.9)      REFERRING PROVIDER:  Dawley, Teofilo Lupinacci Mulder, DO    REFERRING DIAG: M54.2 (ICD-10-CM) - Cervicalgia   Dry Needling  Rationale for Evaluation and Treatment: Rehabilitation  THERAPY DIAG:  Cervicalgia  Muscle spasm of back  ONSET DATE: subacute on chronic   SUBJECTIVE:                                                                                                                                                                                           SUBJECTIVE STATEMENT: Pt states he was standing about 15 hours yesterday working at [employer] He feels it more in the lateral aspect of the hips and back. Feels the TPDN helped after last time.   PERTINENT HISTORY:  L5 S1 fusion bony, L3-4 plate  fusion  PAIN:  Are you having pain? Yes: NPRS scale: 2/10 Pain location: low back  Pain description: spasms, general discomfort Aggravating factors: looking up Relieving factors: ice  PRECAUTIONS:  None  RED FLAGS: None   WEIGHT BEARING RESTRICTIONS:  No  FALLS:  Has patient fallen in last 6 months? 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  DIAGNOSTIC FINDINGS:  No recent imaging available  PATIENT SURVEYS:  FOTO 50  POSTURE:  forward head, decreased lumbar lordosis, and decreased thoracic kyphosis  HAND DOMINANCE:  Right     Body Part #1 Cervical  PALPATION: Eval: Passive range of motion greater than active range of motion, soft stretchy end feel  CERVICAL ROM:   Active ROM A/PROM (deg) eval 11/13/22 10/14 11/20/22 11/4  Flexion 18 35  41   Extension 22 35  42   Right lateral flexion 14 20  24  improves to 25 26  Left lateral flexion 18 18  19  improves to 25 26  Right rotation 50 52 62 after manual 64 improved to 73 66  Left rotation 52 55 62 after manual 70 improved to 73 68   (Blank rows = not tested)   TREATMENT:           Treatment                            11/6  Trigger Point Dry-Needling  Treatment instructions: Expect mild to moderate muscle soreness. S/S of pneumothorax if dry needled over a lung field, and to seek immediate medical attention should they occur. Patient verbalized understanding of these instructions and education.   Patient Consent Given: Yes Education (verbally/handout)provided: Yes Muscles treated: bilat L3-4 paraspinals with shelfing technique Electrical stimulation performed: no Parameters:   N/A Treatment response/outcome: twitch response, elicited, decrease in soft tissue tension  STM bilat QL and L1-5  paraspinals  Review of hip hinging with dowel at wall and to chair Lifting mechanics discussion   Treatment                            11/4:  Trigger Point Dry Needling, Manual Therapy Treatment:  Initial or subsequent education regarding Trigger Point Dry Needling: Subsequent Did patient give consent to treatment with Trigger Point Dry Needling: Yes TPDN with skilled palpation and monitoring followed by STM to the following muscles: Rt glut max/med, Lt thoracic paraspinals followed by mid-thoracic rib mobs bilaterally  Seated piriformis stretch Standing squat deadlift with blue tband Seated hip hinge with bar- progressed to sit<>stand, to table taps Seated hamstring stretch Standing horizontal abduction lift from wall    10/28 STM L UT, bilat cervical paraspinals UPA grade III-IV bilat C3-6, inf medial glide grade IV same levels Mnaula traction Manual traction with chin tuck 2x10  Supine chin tuck with rotation 2x10 RTB shrug with head tilt (loaded eccentric, light) 2x10 SNAG cervical rotation with pillowcase 2x10 bilat  11/27/22 Manual: STM to cervical paraspinals, Grade II-III R and L UPA C3-C7, manual traction, L lateral glide C3-C7, UT stretch Prone shoulder extension 3# 2 x 15 Prone shoulder horizontal abduction 3# 2 x 10  Open book short lever arm shoulder 1 x 10 with 5 seconds bilateral    Treatment                            11/24/22:  Trigger Point Dry Needling, Manual Therapy Treatment:  Initial or subsequent education regarding Trigger Point Dry Needling: Subsequent Did patient give consent to treatment with Trigger Point Dry Needling: Yes TPDN with skilled palpation and monitoring followed by STM to the following muscles: Lt upper trap, levator, rhomboid  Prone retraction, + GHJ ext Rt sidelying resisted scap protraction/retraction Sidelying Lt  ER  Sidelying Lt scapular mobilizations & distraction Supine cervical distraction, bil first rib mobilization, suboccipital release, C0 on C1 AP mobs 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-1 knee on table, ipsilat foot off table and on floor     PATIENT EDUCATION:  Education details: Teacher, music of condition, POC, HEP, exercise form/rationale Person educated: Patient and Spouse Education method: Explanation, Demonstration, Actor cues, Verbal cues, and Handouts Education comprehension: verbalized understanding, returned demonstration, verbal cues required, tactile cues required, and needs further education  HOME EXERCISE PROGRAM: [id number]   ASSESSMENT:  CLINICAL IMPRESSION: Session time limited due to check in error. Plan for PN at next session or soon thereafter. Pt did have improvement in lumbar pain following TPDN annd manual therapy with improvement in soft tissue extensbility. Pt HEP reviewed for proper hip hinge technique vs a quad dominant squatting motion. Pt able to return demo. Plan for formal measurements and continuation of upper quarter and lumbopelvic strengthening as able. Pt would benefit from continued skilled therapy in order to reach goals and maximize functional cervical and lumboelvic strength and ROM for prevention of further functional decline.   REHAB POTENTIAL: Good  CLINICAL DECISION MAKING: Stable/uncomplicated  EVALUATION COMPLEXITY: Low   GOALS: Goals reviewed with patient? Yes  SHORT TERM GOALS: Target date: 10/26  Able to demo proper resting posture with awareness of correction of forward head.  Baseline: Goal status: achieved 10/21    LONG TERM GOALS: Target date: POC date  Meet FOTO goal Baseline:  Goal status: INITIAL  2.  AROM cervical rotation to 60 deg bil without discomfort Baseline:  Goal status: INITIAL  3.  Able to participate in activities without limitation by spasm Baseline:  Goal status: INITIAL  4.  Bil GHJ ER to  5/5 Baseline:  Goal status: INITIAL   PLAN:  PT FREQUENCY: 1-2x/week  PT DURATION: 10 weeks  PLANNED INTERVENTIONS: Therapeutic exercises, Therapeutic activity, Neuromuscular re-education, Patient/Family education, Self Care, Joint mobilization, Stair training, Aquatic Therapy, Dry Needling, Electrical stimulation, Spinal mobilization, Cryotherapy, Moist heat, Taping, Ionotophoresis 4mg /ml Dexamethasone, Manual therapy, and Re-evaluation.  PLAN FOR NEXT SESSION: DN PRN, deep neck flexor & periscap strengthening.   Zebedee Iba PT, DPT 12/10/22 11:01 AM

## 2022-12-15 ENCOUNTER — Encounter (HOSPITAL_BASED_OUTPATIENT_CLINIC_OR_DEPARTMENT_OTHER): Payer: Self-pay | Admitting: Physical Therapy

## 2022-12-15 ENCOUNTER — Ambulatory Visit (HOSPITAL_BASED_OUTPATIENT_CLINIC_OR_DEPARTMENT_OTHER): Payer: HMO | Admitting: Physical Therapy

## 2022-12-15 DIAGNOSIS — M62830 Muscle spasm of back: Secondary | ICD-10-CM

## 2022-12-15 DIAGNOSIS — M542 Cervicalgia: Secondary | ICD-10-CM

## 2022-12-15 NOTE — Therapy (Signed)
OUTPATIENT PHYSICAL THERAPY Treatment   Patient Name: CYLAN FOUCAULT MRN: [id number] DOB:[birth year], 72 y.o., male Today's Date: 12/15/2022  END OF SESSION:  PT End of Session - 12/15/22 1049     Visit Number 11    Number of Visits 21    Date for PT Re-Evaluation 01/17/23    Authorization Type HTA    PT Start Time 1100    PT Stop Time 1145    PT Time Calculation (min) 45 min    Activity Tolerance Patient tolerated treatment well    Behavior During Therapy WFL for tasks assessed/performed                Past Medical History:  Diagnosis Date   Arthritis    Obesity (BMI 30-39.9)    Obstructive sleep apnea    severe- on VPAP auto    Perirectal abscess    Past Surgical History:  Procedure Laterality Date   ANKLE SURGERY     BACK SURGERY     HERNIA REPAIR     umb hernia   INCISION AND DRAINAGE PERIRECTAL ABSCESS  04/08/2011   Procedure: IRRIGATION AND DEBRIDEMENT PERIRECTAL ABSCESS;  Surgeon: Clovis Pu. Cornett, MD;  Location: [facility] OR;  Service: General;  Laterality: N/A;   LIGAMENT REPAIR     right arm ulna   NASAL SINUS SURGERY     Patient Active Problem List   Diagnosis Date Noted   Obstructive sleep apnea    Obesity (BMI 30-39.9)      REFERRING PROVIDER:  Dawley, Troy C, DO    REFERRING DIAG: M54.2 (ICD-10-CM) - Cervicalgia   Dry Needling  Rationale for Evaluation and Treatment: Rehabilitation  THERAPY DIAG:  Cervicalgia  Muscle spasm of back  ONSET DATE: subacute on chronic   SUBJECTIVE:                                                                                                                                                                                           SUBJECTIVE STATEMENT: Pt states that the R neck is tighter today and is limited in SB. TPDN did help the back out last time.   PERTINENT HISTORY:  L5 S1 fusion bony, L3-4 plate fusion  PAIN:  Are you having pain? No: NPRS scale: 0/10 Pain location: low back  Pain  description: spasms, general discomfort Aggravating factors: looking up Relieving factors: ice  PRECAUTIONS:  None  RED FLAGS: None   WEIGHT BEARING RESTRICTIONS:  No  FALLS:  Has patient fallen in last 6 months? 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  DIAGNOSTIC FINDINGS:  No recent imaging available  PATIENT SURVEYS:  FOTO 50  11/11 FOTO 61  POSTURE:  forward head, decreased lumbar lordosis, and decreased thoracic kyphosis  HAND DOMINANCE:  Right   Body Part #1 Cervical  PALPATION: Eval: Passive range of motion greater than active range of motion, soft stretchy end feel  CERVICAL ROM:   Active ROM A/PROM (deg) eval 11/13/22 10/14 11/20/22 11/4 11/11  Flexion 18 35  41  50  Extension 22 35  42  42  Right lateral flexion 14 20  24  improves to 25 26 27   Left lateral flexion 18 18  19  improves to 25 26 28   Right rotation 50 52 62 after manual 64 improved to 73 66 68  Left rotation 52 55 62 after manual 70 improved to 73 68 69   (Blank rows = not tested)   TREATMENT:           Treatment                            11/11  Trigger Point Dry-Needling  Treatment instructions: Expect mild to moderate muscle soreness. S/S of pneumothorax if dry needled over a lung field, and to seek immediate medical attention should they occur. Patient verbalized understanding of these instructions and education.   Patient Consent Given: Yes Education (verbally/handout)provided: Yes Muscles treated: bilat L3-4 paraspinals with shelfing technique Electrical stimulation performed: no Parameters:   N/A Treatment response/outcome: twitch response, elicited, decrease in soft tissue tension  STM bilat QL and L1-5 paraspinals  STM L UT, bilat cervical paraspinals UPA  grade III-IV bilat C3-6, inf medial glide grade IV same levels Manual traction    Treatment                            11/6  Trigger Point Dry-Needling  Treatment instructions: Expect mild to moderate muscle soreness. S/S of pneumothorax if dry needled over a lung field, and to seek immediate medical attention should they occur. Patient verbalized understanding of these instructions and education.   Patient Consent Given: Yes Education (verbally/handout)provided: Yes Muscles treated: bilat L3-4 paraspinals with shelfing technique Electrical stimulation performed: no Parameters:   N/A Treatment response/outcome: twitch response, elicited, decrease in soft tissue tension  STM bilat QL and L1-5 paraspinals  Review of hip hinging with dowel at wall and to chair Lifting mechanics discussion   Treatment                            11/4:  Trigger Point Dry Needling, Manual Therapy Treatment:  Initial or subsequent education regarding Trigger Point Dry Needling: Subsequent Did patient give consent to treatment with Trigger Point Dry Needling: Yes TPDN with skilled palpation and monitoring followed by STM to the following muscles: Rt glut max/med, Lt thoracic paraspinals followed by mid-thoracic rib mobs bilaterally  Seated piriformis stretch Standing squat deadlift with blue tband Seated hip hinge with bar- progressed to sit<>stand, to table taps Seated hamstring stretch Standing horizontal abduction lift from wall    10/28 STM L UT, bilat cervical paraspinals UPA grade III-IV bilat C3-6, inf medial glide grade IV same levels Mnaula traction Manual traction with chin tuck 2x10  Supine  chin tuck with rotation 2x10 RTB shrug with head tilt (loaded eccentric, light) 2x10 SNAG cervical rotation with pillowcase 2x10 bilat                                                                                                                       11/27/22 Manual: STM to cervical paraspinals,  Grade II-III R and L UPA C3-C7, manual traction, L lateral glide C3-C7, UT stretch Prone shoulder extension 3# 2 x 15 Prone shoulder horizontal abduction 3# 2 x 10  Open book short lever arm shoulder 1 x 10 with 5 seconds bilateral    Treatment                            11/24/22:  Trigger Point Dry Needling, Manual Therapy Treatment:  Initial or subsequent education regarding Trigger Point Dry Needling: Subsequent Did patient give consent to treatment with Trigger Point Dry Needling: Yes TPDN with skilled palpation and monitoring followed by STM to the following muscles: Lt upper trap, levator, rhomboid  Prone retraction, + GHJ ext Rt sidelying resisted scap protraction/retraction Sidelying Lt ER  Sidelying Lt scapular mobilizations & distraction Supine cervical distraction, bil first rib mobilization, suboccipital release, C0 on C1 AP mobs 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-1 knee on table, ipsilat foot off table and on floor     PATIENT EDUCATION:  Education details: Teacher, music of condition, POC, HEP, exercise form/rationale Person educated: Patient and Spouse Education method: Programmer, multimedia, Demonstration, Tactile cues, Verbal cues, and Handouts Education comprehension: verbalized understanding, returned demonstration, verbal cues required, tactile cues required, and needs further education  HOME EXERCISE PROGRAM: [id number]   ASSESSMENT:  CLINICAL IMPRESSION: Pt presents with increase in R sided neck ROM deficits and lumbar pain due to travel. Pt did have improvement in ROM following session. Pt also demonstrates significant improvement in pain free cervical ROM as compared to previous measures as well as improvement in self reported outcome measure. Pt's cervical motion has improved greatly to allow for better daily driving demands. However, recurrent pain still suggests underlying muscular and joint related dysfunction at cervical and lumbar region. Continue with pain  management and exercise progression as tolerated. Pt would benefit from continued skilled therapy in order to reach goals and maximize functional cervical and lumboelvic strength and ROM for prevention of further functional decline.   REHAB POTENTIAL: Good  CLINICAL DECISION MAKING: Stable/uncomplicated  EVALUATION COMPLEXITY: Low   GOALS: Goals reviewed with patient? Yes  SHORT TERM GOALS: Target date: 10/26  Able to demo proper resting posture with awareness of correction of forward head.  Baseline: Goal status: achieved 10/21    LONG TERM GOALS: Target date: POC date  Meet FOTO goal Baseline:  Goal status: ongonig  2.  AROM cervical rotation to 60 deg bil without discomfort Baseline:  met  3.  Able to participate in activities without limitation by spasm Baseline:  Goal status: ongoing  4.  Bil  GHJ ER to 5/5 Baseline:  Goal status: ongoiing   PLAN:  PT FREQUENCY: 1-2x/week  PT DURATION: 10 weeks  PLANNED INTERVENTIONS: Therapeutic exercises, Therapeutic activity, Neuromuscular re-education, Patient/Family education, Self Care, Joint mobilization, Stair training, Aquatic Therapy, Dry Needling, Electrical stimulation, Spinal mobilization, Cryotherapy, Moist heat, Taping, Ionotophoresis 4mg /ml Dexamethasone, Manual therapy, and Re-evaluation.  PLAN FOR NEXT SESSION: DN PRN, deep neck flexor & periscap strengthening.   Zebedee Iba PT, DPT 12/15/22 12:56 PM

## 2022-12-17 ENCOUNTER — Ambulatory Visit (HOSPITAL_BASED_OUTPATIENT_CLINIC_OR_DEPARTMENT_OTHER): Payer: HMO | Admitting: Physical Therapy

## 2022-12-17 ENCOUNTER — Encounter (HOSPITAL_BASED_OUTPATIENT_CLINIC_OR_DEPARTMENT_OTHER): Payer: Self-pay | Admitting: Physical Therapy

## 2022-12-17 DIAGNOSIS — M542 Cervicalgia: Secondary | ICD-10-CM

## 2022-12-17 DIAGNOSIS — M62830 Muscle spasm of back: Secondary | ICD-10-CM

## 2022-12-17 NOTE — Therapy (Signed)
OUTPATIENT PHYSICAL THERAPY Treatment   Patient Name: Eric Cochran MRN: [id number] DOB:1951/02/01, 72 y.o., male Today's Date: 12/17/2022  END OF SESSION:  PT End of Session - 12/17/22 1241     Visit Number 12    Number of Visits 21    Date for PT Re-Evaluation 01/17/23    Authorization Type HTA    PT Start Time 1023   late PT start   PT Stop Time 1056    PT Time Calculation (min) 33 min    Activity Tolerance Patient tolerated treatment well    Behavior During Therapy WFL for tasks assessed/performed                 Past Medical History:  Diagnosis Date   Arthritis    Obesity (BMI 30-39.9)    Obstructive sleep apnea    severe- on VPAP auto    Perirectal abscess    Past Surgical History:  Procedure Laterality Date   ANKLE SURGERY     BACK SURGERY     HERNIA REPAIR     umb hernia   INCISION AND DRAINAGE PERIRECTAL ABSCESS  04/08/2011   Procedure: IRRIGATION AND DEBRIDEMENT PERIRECTAL ABSCESS;  Surgeon: Clovis Pu. Cornett, MD;  Location: [facility] OR;  Service: General;  Laterality: N/A;   LIGAMENT REPAIR     right arm ulna   NASAL SINUS SURGERY     Patient Active Problem List   Diagnosis Date Noted   Obstructive sleep apnea    Obesity (BMI 30-39.9)      REFERRING PROVIDER:  Dawley, Troy C, DO    REFERRING DIAG: M54.2 (ICD-10-CM) - Cervicalgia   Dry Needling  Rationale for Evaluation and Treatment: Rehabilitation  THERAPY DIAG:  Cervicalgia  Muscle spasm of back  ONSET DATE: subacute on chronic   SUBJECTIVE:                                                                                                                                                                                           SUBJECTIVE STATEMENT: Pt states that that neck pain is sore from the soft tissue work but it is still painful. The back does seem much better.   PERTINENT HISTORY:  L5 S1 fusion bony, L3-4 plate fusion  PAIN:  Are you having pain? Yes: NPRS scale:  2/10 Pain location: low back  Pain description: spasms, general discomfort Aggravating factors: looking up Relieving factors: ice  PRECAUTIONS:  None  RED FLAGS: None   WEIGHT BEARING RESTRICTIONS:  No  FALLS:  Has patient fallen in last 6 months? 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  DIAGNOSTIC FINDINGS:  No recent imaging available  PATIENT SURVEYS:  FOTO 50  11/11 FOTO 61  POSTURE:  forward head, decreased lumbar lordosis, and decreased thoracic kyphosis  HAND DOMINANCE:  Right   Body Part #1 Cervical  PALPATION: Eval: Passive range of motion greater than active range of motion, soft stretchy end feel  CERVICAL ROM:   Active ROM A/PROM (deg) eval 11/13/22 10/14 11/20/22 11/4 11/11  Flexion 18 35  41  50  Extension 22 35  42  42  Right lateral flexion 14 20  24  improves to 25 26 27   Left lateral flexion 18 18  19  improves to 25 26 28   Right rotation 50 52 62 after manual 64 improved to 73 66 68  Left rotation 52 55 62 after manual 70 improved to 73 68 69   (Blank rows = not tested)   TREATMENT:           11/13  Trigger Point Dry-Needling  Treatment instructions: Expect mild to moderate muscle soreness. S/S of pneumothorax if dry needled over a lung field, and to seek immediate medical attention should they occur. Patient verbalized understanding of these instructions and education.   Patient Consent Given: Yes Education (verbally/handout)provided: Yes Muscles treated: bilat L3-4 paraspinals with shelfing technique; R C4-7 paraspinals with shelfing Electrical stimulation performed: no Parameters:   N/A Treatment response/outcome: twitch response, elicited, decrease in soft tissue tension  STM bilat QL and L1-5 paraspinals Prone R UPA  grade III-IV R C3-6  Curl up 3s 2x10 Self STM techniques Rocabado exercises for jaw pain (hand out provided) Postural changes to reduce TMD; chin tucking, jaw neutral resting position   Treatment                            11/11  Trigger Point Dry-Needling  Treatment instructions: Expect mild to moderate muscle soreness. S/S of pneumothorax if dry needled over a lung field, and to seek immediate medical attention should they occur. Patient verbalized understanding of these instructions and education.   Patient Consent Given: Yes Education (verbally/handout)provided: Yes Muscles treated: bilat L3-4 paraspinals with shelfing technique Electrical stimulation performed: no Parameters:   N/A Treatment response/outcome: twitch response, elicited, decrease in soft tissue tension  STM bilat QL and L1-5 paraspinals  STM L UT, bilat cervical paraspinals UPA grade III-IV bilat C3-6, inf medial glide grade IV same levels Manual traction    Treatment                            11/6  Trigger Point Dry-Needling  Treatment instructions: Expect mild to moderate muscle soreness. S/S of pneumothorax if dry needled over a lung field, and to seek immediate medical attention should they occur. Patient verbalized understanding of these instructions and education.   Patient Consent Given: Yes Education (verbally/handout)provided: Yes Muscles treated: bilat L3-4 paraspinals with shelfing technique Electrical stimulation performed: no Parameters:   N/A Treatment response/outcome: twitch response, elicited, decrease in soft tissue tension  STM bilat QL and L1-5 paraspinals  Review of hip hinging with dowel at wall and to chair Lifting mechanics discussion   Treatment  11/4:  Trigger Point Dry Needling, Manual Therapy Treatment:  Initial or subsequent education regarding Trigger Point Dry Needling: Subsequent Did patient give consent to treatment with Trigger Point Dry  Needling: Yes TPDN with skilled palpation and monitoring followed by STM to the following muscles: Rt glut max/med, Lt thoracic paraspinals followed by mid-thoracic rib mobs bilaterally  Seated piriformis stretch Standing squat deadlift with blue tband Seated hip hinge with bar- progressed to sit<>stand, to table taps Seated hamstring stretch Standing horizontal abduction lift from wall    10/28 STM L UT, bilat cervical paraspinals UPA grade III-IV bilat C3-6, inf medial glide grade IV same levels Mnaula traction Manual traction with chin tuck 2x10  Supine chin tuck with rotation 2x10 RTB shrug with head tilt (loaded eccentric, light) 2x10 SNAG cervical rotation with pillowcase 2x10 bilat                                                                                                                       11/27/22 Manual: STM to cervical paraspinals, Grade II-III R and L UPA C3-C7, manual traction, L lateral glide C3-C7, UT stretch Prone shoulder extension 3# 2 x 15 Prone shoulder horizontal abduction 3# 2 x 10  Open book short lever arm shoulder 1 x 10 with 5 seconds bilateral    Treatment                            11/24/22:  Trigger Point Dry Needling, Manual Therapy Treatment:  Initial or subsequent education regarding Trigger Point Dry Needling: Subsequent Did patient give consent to treatment with Trigger Point Dry Needling: Yes TPDN with skilled palpation and monitoring followed by STM to the following muscles: Lt upper trap, levator, rhomboid  Prone retraction, + GHJ ext Rt sidelying resisted scap protraction/retraction Sidelying Lt ER  Sidelying Lt scapular mobilizations & distraction Supine cervical distraction, bil first rib mobilization, suboccipital release, C0 on C1 AP mobs 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-1 knee on table, ipsilat foot off table and on floor     PATIENT EDUCATION:  Education details: Teacher, music of condition, POC, HEP, exercise  form/rationale Person educated: Patient and Spouse Education method: Programmer, multimedia, Demonstration, Actor cues, Verbal cues, and Handouts Education comprehension: verbalized understanding, returned demonstration, verbal cues required, tactile cues required, and needs further education  HOME EXERCISE PROGRAM: [id number]   ASSESSMENT:  CLINICAL IMPRESSION: Pt reports R sided jaw pain which is likely related to R upper quarter muscle tension and postural/positioning related pain. Pt did have improvement in R sided neck pain following session and advised on self management techniques. Consider f/u with TMD therapist at future visits if needed. Lumbar exercise expanded to include neutral spine curl up with cuing needed for bracing technique. No pain noted. Consider progression of neutral spine related strengthening for lumbar region. Continue with pain management and exercise progression as tolerated. Pt would benefit from continued skilled therapy in order to reach goals and maximize  functional cervical and lumboelvic strength and ROM for prevention of further functional decline.   REHAB POTENTIAL: Good  CLINICAL DECISION MAKING: Stable/uncomplicated  EVALUATION COMPLEXITY: Low   GOALS: Goals reviewed with patient? Yes  SHORT TERM GOALS: Target date: 10/26  Able to demo proper resting posture with awareness of correction of forward head.  Baseline: Goal status: achieved 10/21    LONG TERM GOALS: Target date: POC date  Meet FOTO goal Baseline:  Goal status: ongonig  2.  AROM cervical rotation to 60 deg bil without discomfort Baseline:  met  3.  Able to participate in activities without limitation by spasm Baseline:  Goal status: ongoing  4.  Bil GHJ ER to 5/5 Baseline:  Goal status: ongoiing   PLAN:  PT FREQUENCY: 1-2x/week  PT DURATION: 10 weeks  PLANNED INTERVENTIONS: Therapeutic exercises, Therapeutic activity, Neuromuscular re-education, Patient/Family education,  Self Care, Joint mobilization, Stair training, Aquatic Therapy, Dry Needling, Electrical stimulation, Spinal mobilization, Cryotherapy, Moist heat, Taping, Ionotophoresis 4mg /ml Dexamethasone, Manual therapy, and Re-evaluation.  PLAN FOR NEXT SESSION: DN PRN, deep neck flexor & periscap strengthening.   Zebedee Iba PT, DPT 12/17/22 12:49 PM

## 2022-12-22 ENCOUNTER — Ambulatory Visit (HOSPITAL_BASED_OUTPATIENT_CLINIC_OR_DEPARTMENT_OTHER): Payer: HMO | Admitting: Physical Therapy

## 2022-12-22 ENCOUNTER — Encounter (HOSPITAL_BASED_OUTPATIENT_CLINIC_OR_DEPARTMENT_OTHER): Payer: Self-pay | Admitting: Physical Therapy

## 2022-12-22 DIAGNOSIS — M62830 Muscle spasm of back: Secondary | ICD-10-CM

## 2022-12-22 DIAGNOSIS — M542 Cervicalgia: Secondary | ICD-10-CM

## 2022-12-22 NOTE — Therapy (Signed)
OUTPATIENT PHYSICAL THERAPY Treatment   Patient Name: Eric Cochran MRN: [id number] DOB:April 30, 1950, 72 y.o., male Today's Date: 12/22/2022  END OF SESSION:  PT End of Session - 12/22/22 1148     Visit Number 13    Number of Visits 21    Date for PT Re-Evaluation 01/17/23    Authorization Type HTA    PT Start Time 1100    PT Stop Time 1140    PT Time Calculation (min) 40 min    Activity Tolerance Patient tolerated treatment well    Behavior During Therapy WFL for tasks assessed/performed                  Past Medical History:  Diagnosis Date   Arthritis    Obesity (BMI 30-39.9)    Obstructive sleep apnea    severe- on VPAP auto    Perirectal abscess    Past Surgical History:  Procedure Laterality Date   ANKLE SURGERY     BACK SURGERY     HERNIA REPAIR     umb hernia   INCISION AND DRAINAGE PERIRECTAL ABSCESS  04/08/2011   Procedure: IRRIGATION AND DEBRIDEMENT PERIRECTAL ABSCESS;  Surgeon: Clovis Pu. Cornett, MD;  Location: [facility] OR;  Service: General;  Laterality: N/A;   LIGAMENT REPAIR     right arm ulna   NASAL SINUS SURGERY     Patient Active Problem List   Diagnosis Date Noted   Obstructive sleep apnea    Obesity (BMI 30-39.9)      REFERRING PROVIDER:  Dawley, Troy C, DO    REFERRING DIAG: M54.2 (ICD-10-CM) - Cervicalgia   Dry Needling  Rationale for Evaluation and Treatment: Rehabilitation  THERAPY DIAG:  Cervicalgia  Muscle spasm of back  ONSET DATE: subacute on chronic   SUBJECTIVE:                                                                                                                                                                                           SUBJECTIVE STATEMENT: Pt states the neck feels better except for one spot. Pt states the low back pain is moving around still. The pain is persistent.   PERTINENT HISTORY:  L5 S1 fusion bony, L3-4 plate fusion  PAIN:  Are you having pain? Yes: NPRS scale: 2/10 Pain  location: low back  Pain description: spasms, general discomfort Aggravating factors: looking up Relieving factors: ice  PRECAUTIONS:  None  RED FLAGS: None   WEIGHT BEARING RESTRICTIONS:  No  FALLS:  Has patient fallen in last 6 months? 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  DIAGNOSTIC FINDINGS:  No recent imaging available  PATIENT SURVEYS:  FOTO 50  11/11 FOTO 61  POSTURE:  forward head, decreased lumbar lordosis, and decreased thoracic kyphosis  HAND DOMINANCE:  Right   Body Part #1 Cervical  PALPATION: Eval: Passive range of motion greater than active range of motion, soft stretchy end feel  CERVICAL ROM:   Active ROM A/PROM (deg) eval 11/13/22 10/14 11/20/22 11/4 11/11  Flexion 18 35  41  50  Extension 22 35  42  42  Right lateral flexion 14 20  24  improves to 25 26 27   Left lateral flexion 18 18  19  improves to 25 26 28   Right rotation 50 52 62 after manual 64 improved to 73 66 68  Left rotation 52 55 62 after manual 70 improved to 73 68 69   (Blank rows = not tested)   TREATMENT:          11/18  CPA and UPA T3-12 grade III-IV  STM T/S paraspinals  Seated T/S ext 10x 3s  Open book 10x each side 5s Seated T/S ext with towel roll 5s 2x10    11/13  Trigger Point Dry-Needling  Treatment instructions: Expect mild to moderate muscle soreness. S/S of pneumothorax if dry needled over a lung field, and to seek immediate medical attention should they occur. Patient verbalized understanding of these instructions and education.   Patient Consent Given: Yes Education (verbally/handout)provided: Yes Muscles treated: bilat L3-4 paraspinals with shelfing technique; R C4-7 paraspinals with shelfing Electrical stimulation performed:  no Parameters:   N/A Treatment response/outcome: twitch response, elicited, decrease in soft tissue tension  STM bilat QL and L1-5 paraspinals Prone R UPA grade III-IV R C3-6  Curl up 3s 2x10 Self STM techniques Rocabado exercises for jaw pain (hand out provided) Postural changes to reduce TMD; chin tucking, jaw neutral resting position   Treatment                            11/11  Trigger Point Dry-Needling  Treatment instructions: Expect mild to moderate muscle soreness. S/S of pneumothorax if dry needled over a lung field, and to seek immediate medical attention should they occur. Patient verbalized understanding of these instructions and education.   Patient Consent Given: Yes Education (verbally/handout)provided: Yes Muscles treated: bilat L3-4 paraspinals with shelfing technique Electrical stimulation performed: no Parameters:   N/A Treatment response/outcome: twitch response, elicited, decrease in soft tissue tension  STM bilat QL and L1-5 paraspinals  STM L UT, bilat cervical paraspinals UPA grade III-IV bilat C3-6, inf medial glide grade IV same levels Manual traction    Treatment                            11/6  Trigger Point Dry-Needling  Treatment instructions: Expect mild to moderate muscle soreness. S/S of pneumothorax if dry needled over a lung field, and to seek immediate medical attention should they occur. Patient verbalized understanding of these instructions and education.   Patient Consent Given: Yes Education (verbally/handout)provided: Yes Muscles treated: bilat L3-4 paraspinals with shelfing technique Electrical stimulation performed: no Parameters:   N/A Treatment response/outcome: twitch response, elicited, decrease in soft tissue tension  STM bilat QL and L1-5 paraspinals  Review  of hip hinging with dowel at wall and to chair Lifting mechanics discussion   Treatment                            11/4:  Trigger Point Dry Needling, Manual  Therapy Treatment:  Initial or subsequent education regarding Trigger Point Dry Needling: Subsequent Did patient give consent to treatment with Trigger Point Dry Needling: Yes TPDN with skilled palpation and monitoring followed by STM to the following muscles: Rt glut max/med, Lt thoracic paraspinals followed by mid-thoracic rib mobs bilaterally  Seated piriformis stretch Standing squat deadlift with blue tband Seated hip hinge with bar- progressed to sit<>stand, to table taps Seated hamstring stretch Standing horizontal abduction lift from wall    10/28 STM L UT, bilat cervical paraspinals UPA grade III-IV bilat C3-6, inf medial glide grade IV same levels Mnaula traction Manual traction with chin tuck 2x10  Supine chin tuck with rotation 2x10 RTB shrug with head tilt (loaded eccentric, light) 2x10 SNAG cervical rotation with pillowcase 2x10 bilat                                                                                                                       11/27/22 Manual: STM to cervical paraspinals, Grade II-III R and L UPA C3-C7, manual traction, L lateral glide C3-C7, UT stretch Prone shoulder extension 3# 2 x 15 Prone shoulder horizontal abduction 3# 2 x 10  Open book short lever arm shoulder 1 x 10 with 5 seconds bilateral    Treatment                            11/24/22:  Trigger Point Dry Needling, Manual Therapy Treatment:  Initial or subsequent education regarding Trigger Point Dry Needling: Subsequent Did patient give consent to treatment with Trigger Point Dry Needling: Yes TPDN with skilled palpation and monitoring followed by STM to the following muscles: Lt upper trap, levator, rhomboid  Prone retraction, + GHJ ext Rt sidelying resisted scap protraction/retraction Sidelying Lt ER  Sidelying Lt scapular mobilizations & distraction Supine cervical distraction, bil first rib mobilization, suboccipital release, C0 on C1 AP mobs Wall push up plus Lawn mower  yellow tband-1 knee on table, ipsilat foot off table and on floor     PATIENT EDUCATION:  Education details: Teacher, music of condition, POC, HEP, exercise form/rationale Person educated: Patient and Spouse Education method: Programmer, multimedia, Demonstration, Actor cues, Verbal cues, and Handouts Education comprehension: verbalized understanding, returned demonstration, verbal cues required, tactile cues required, and needs further education  HOME EXERCISE PROGRAM: [id number]   ASSESSMENT:  CLINICAL IMPRESSION: Manual joint mob reveals extension and rotational deficits within the T/S as well as muscular tension limitations that may be contributing to neck and LBP. Pt very stiff into ext and rotation. HEP updated accordingly. Pt advised that he may consider a gentle yoga class for improving mobility. Plan to progress more towards exercise at  future sessions as pt's goal is to join the gym for fitness and maintaining exercise program with therapy. Pt would benefit from continued skilled therapy in order to reach goals and maximize functional cervical and lumboelvic strength and ROM for prevention of further functional decline.   REHAB POTENTIAL: Good  CLINICAL DECISION MAKING: Stable/uncomplicated  EVALUATION COMPLEXITY: Low   GOALS: Goals reviewed with patient? Yes  SHORT TERM GOALS: Target date: 10/26  Able to demo proper resting posture with awareness of correction of forward head.  Baseline: Goal status: achieved 10/21    LONG TERM GOALS: Target date: POC date  Meet FOTO goal Baseline:  Goal status: ongonig  2.  AROM cervical rotation to 60 deg bil without discomfort Baseline:  met  3.  Able to participate in activities without limitation by spasm Baseline:  Goal status: ongoing  4.  Bil GHJ ER to 5/5 Baseline:  Goal status: ongoiing   PLAN:  PT FREQUENCY: 1-2x/week  PT DURATION: 10 weeks  PLANNED INTERVENTIONS: Therapeutic exercises, Therapeutic activity,  Neuromuscular re-education, Patient/Family education, Self Care, Joint mobilization, Stair training, Aquatic Therapy, Dry Needling, Electrical stimulation, Spinal mobilization, Cryotherapy, Moist heat, Taping, Ionotophoresis 4mg /ml Dexamethasone, Manual therapy, and Re-evaluation.  PLAN FOR NEXT SESSION: DN PRN, deep neck flexor & periscap strengthening.   Zebedee Iba PT, DPT 12/22/22 11:49 AM

## 2022-12-24 ENCOUNTER — Ambulatory Visit (HOSPITAL_BASED_OUTPATIENT_CLINIC_OR_DEPARTMENT_OTHER): Payer: HMO | Admitting: Physical Therapy

## 2022-12-24 ENCOUNTER — Encounter (HOSPITAL_BASED_OUTPATIENT_CLINIC_OR_DEPARTMENT_OTHER): Payer: Self-pay | Admitting: Physical Therapy

## 2022-12-24 DIAGNOSIS — M62830 Muscle spasm of back: Secondary | ICD-10-CM

## 2022-12-24 DIAGNOSIS — M542 Cervicalgia: Secondary | ICD-10-CM

## 2022-12-24 NOTE — Therapy (Signed)
OUTPATIENT PHYSICAL THERAPY TREATMENT   Patient Name: Eric Cochran MRN: [id number] DOB:April 12, 1950, 72 y.o., male Today's Date: 12/24/2022  END OF SESSION:  PT End of Session - 12/24/22 1033     Visit Number 14    Number of Visits 21    Date for PT Re-Evaluation 01/17/23    Authorization Type HTA    PT Start Time 1018    PT Stop Time 1100    PT Time Calculation (min) 42 min    Activity Tolerance Patient tolerated treatment well    Behavior During Therapy WFL for tasks assessed/performed                   Past Medical History:  Diagnosis Date   Arthritis    Obesity (BMI 30-39.9)    Obstructive sleep apnea    severe- on VPAP auto    Perirectal abscess    Past Surgical History:  Procedure Laterality Date   ANKLE SURGERY     BACK SURGERY     HERNIA REPAIR     umb hernia   INCISION AND DRAINAGE PERIRECTAL ABSCESS  04/08/2011   Procedure: IRRIGATION AND DEBRIDEMENT PERIRECTAL ABSCESS;  Surgeon: Clovis Pu. Cornett, MD;  Location: [facility] OR;  Service: General;  Laterality: N/A;   LIGAMENT REPAIR     right arm ulna   NASAL SINUS SURGERY     Patient Active Problem List   Diagnosis Date Noted   Obstructive sleep apnea    Obesity (BMI 30-39.9)      REFERRING PROVIDER:  Dawley, Troy C, DO    REFERRING DIAG: M54.2 (ICD-10-CM) - Cervicalgia   Dry Needling  Rationale for Evaluation and Treatment: Rehabilitation  THERAPY DIAG:  Cervicalgia  Muscle spasm of back  ONSET DATE: subacute on chronic   SUBJECTIVE:                                                                                                                                                                                           SUBJECTIVE STATEMENT: Pt states the neck feels better except for one spot. Pt states the low back pain is moving around still. The pain is persistent.   PERTINENT HISTORY:  L5 S1 fusion bony, L3-4 plate fusion  PAIN:  Are you having pain? Yes: NPRS scale:  2/10 Pain location: low back  Pain description: spasms, general discomfort Aggravating factors: looking up Relieving factors: ice  PRECAUTIONS:  None  RED FLAGS: None   WEIGHT BEARING RESTRICTIONS:  No  FALLS:  Has patient fallen in last 6 months? 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  DIAGNOSTIC FINDINGS:  No recent imaging available  PATIENT SURVEYS:  FOTO 50  11/11 FOTO 61  POSTURE:  forward head, decreased lumbar lordosis, and decreased thoracic kyphosis  HAND DOMINANCE:  Right   Body Part #1 Cervical  PALPATION: Eval: Passive range of motion greater than active range of motion, soft stretchy end feel  CERVICAL ROM:   Active ROM A/PROM (deg) eval 11/13/22 10/14 11/20/22 11/4 11/11  Flexion 18 35  41  50  Extension 22 35  42  42  Right lateral flexion 14 20  24  improves to 25 26 27   Left lateral flexion 18 18  19  improves to 25 26 28   Right rotation 50 52 62 after manual 64 improved to 73 66 68  Left rotation 52 55 62 after manual 70 improved to 73 68 69   (Blank rows = not tested)   TREATMENT:          11/20  STM and R UT and L/S  Seated table T/S ext 10x  Cable row 3x8 35lbs Cable shoulder extension 20lbs 2x10 Cable face pull 20lbs 2x10  11/18  CPA and UPA T3-12 grade III-IV  STM T/S paraspinals  Seated T/S ext 10x 3s  Open book 10x each side 5s Seated T/S ext with towel roll 5s 2x10    11/13  Trigger Point Dry-Needling  Treatment instructions: Expect mild to moderate muscle soreness. S/S of pneumothorax if dry needled over a lung field, and to seek immediate medical attention should they occur. Patient verbalized understanding of these instructions and education.   Patient Consent Given: Yes Education  (verbally/handout)provided: Yes Muscles treated: bilat L3-4 paraspinals with shelfing technique; R C4-7 paraspinals with shelfing Electrical stimulation performed: no Parameters:   N/A Treatment response/outcome: twitch response, elicited, decrease in soft tissue tension  STM bilat QL and L1-5 paraspinals Prone R UPA grade III-IV R C3-6  Curl up 3s 2x10 Self STM techniques Rocabado exercises for jaw pain (hand out provided) Postural changes to reduce TMD; chin tucking, jaw neutral resting position   Treatment                            11/11  Trigger Point Dry-Needling  Treatment instructions: Expect mild to moderate muscle soreness. S/S of pneumothorax if dry needled over a lung field, and to seek immediate medical attention should they occur. Patient verbalized understanding of these instructions and education.   Patient Consent Given: Yes Education (verbally/handout)provided: Yes Muscles treated: bilat L3-4 paraspinals with shelfing technique Electrical stimulation performed: no Parameters:   N/A Treatment response/outcome: twitch response, elicited, decrease in soft tissue tension  STM bilat QL and L1-5 paraspinals  STM L UT, bilat cervical paraspinals UPA grade III-IV bilat C3-6, inf medial glide grade IV same levels Manual traction    Treatment                            11/6  Trigger Point Dry-Needling  Treatment instructions: Expect mild to moderate muscle soreness. S/S of pneumothorax if dry needled over a lung field, and to seek immediate medical attention should they occur. Patient verbalized understanding of these instructions and education.   Patient Consent Given: Yes Education (verbally/handout)provided: Yes Muscles treated: bilat  L3-4 paraspinals with shelfing technique Electrical stimulation performed: no Parameters:   N/A Treatment response/outcome: twitch response, elicited, decrease in soft tissue tension  STM bilat QL and L1-5 paraspinals  Review  of hip hinging with dowel at wall and to chair Lifting mechanics discussion   Treatment                            11/4:  Trigger Point Dry Needling, Manual Therapy Treatment:  Initial or subsequent education regarding Trigger Point Dry Needling: Subsequent Did patient give consent to treatment with Trigger Point Dry Needling: Yes TPDN with skilled palpation and monitoring followed by STM to the following muscles: Rt glut max/med, Lt thoracic paraspinals followed by mid-thoracic rib mobs bilaterally  Seated piriformis stretch Standing squat deadlift with blue tband Seated hip hinge with bar- progressed to sit<>stand, to table taps Seated hamstring stretch Standing horizontal abduction lift from wall    10/28 STM L UT, bilat cervical paraspinals UPA grade III-IV bilat C3-6, inf medial glide grade IV same levels Mnaula traction Manual traction with chin tuck 2x10  Supine chin tuck with rotation 2x10 RTB shrug with head tilt (loaded eccentric, light) 2x10 SNAG cervical rotation with pillowcase 2x10 bilat                                                                                                                       11/27/22 Manual: STM to cervical paraspinals, Grade II-III R and L UPA C3-C7, manual traction, L lateral glide C3-C7, UT stretch Prone shoulder extension 3# 2 x 15 Prone shoulder horizontal abduction 3# 2 x 10  Open book short lever arm shoulder 1 x 10 with 5 seconds bilateral    Treatment                            11/24/22:  Trigger Point Dry Needling, Manual Therapy Treatment:  Initial or subsequent education regarding Trigger Point Dry Needling: Subsequent Did patient give consent to treatment with Trigger Point Dry Needling: Yes TPDN with skilled palpation and monitoring followed by STM to the following muscles: Lt upper trap, levator, rhomboid  Prone retraction, + GHJ ext Rt sidelying resisted scap protraction/retraction Sidelying Lt ER  Sidelying Lt  scapular mobilizations & distraction Supine cervical distraction, bil first rib mobilization, suboccipital release, C0 on C1 AP mobs 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-1 knee on table, ipsilat foot off table and on floor     PATIENT EDUCATION:  Education details: Teacher, music of condition, POC, HEP, exercise form/rationale Person educated: Patient and Spouse Education method: Programmer, multimedia, Demonstration, Actor cues, Verbal cues, and Handouts Education comprehension: verbalized understanding, returned demonstration, verbal cues required, tactile cues required, and needs further education  HOME EXERCISE PROGRAM: [id number]   ASSESSMENT:  CLINICAL IMPRESSION: Manual therapy to UT and levator greatly improved R resting tone. HEP was reviewed and pt able to start progressing towards gym based  strengthening exercise as current HEP intensity and volume are no longer sufficiently challenging . With increase in weight, pt does return to to UT compensation position and requires VC and TC for positioning. Being to taper and modify HEP to be more gym based and continue with progression towards independent exercise and referral to personal training here at [facility] when it is appropriate for D/C. Consider lumbopelvic additions at next. Pt would benefit from continued skilled therapy in order to reach goals and maximize functional cervical and lumboelvic strength and ROM for prevention of further functional decline.   REHAB POTENTIAL: Good  CLINICAL DECISION MAKING: Stable/uncomplicated  EVALUATION COMPLEXITY: Low   GOALS: Goals reviewed with patient? Yes  SHORT TERM GOALS: Target date: 10/26  Able to demo proper resting posture with awareness of correction of forward head.  Baseline: Goal status: achieved 10/21    LONG TERM GOALS: Target date: POC date  Meet FOTO goal Baseline:  Goal status: ongonig  2.  AROM cervical rotation to 60 deg bil without discomfort Baseline:   met  3.  Able to participate in activities without limitation by spasm Baseline:  Goal status: ongoing  4.  Bil GHJ ER to 5/5 Baseline:  Goal status: ongoiing   PLAN:  PT FREQUENCY: 1-2x/week  PT DURATION: 10 weeks  PLANNED INTERVENTIONS: Therapeutic exercises, Therapeutic activity, Neuromuscular re-education, Patient/Family education, Self Care, Joint mobilization, Stair training, Aquatic Therapy, Dry Needling, Electrical stimulation, Spinal mobilization, Cryotherapy, Moist heat, Taping, Ionotophoresis 4mg /ml Dexamethasone, Manual therapy, and Re-evaluation.  PLAN FOR NEXT SESSION: DN PRN, deep neck flexor & periscap strengthening.   Zebedee Iba PT, DPT 12/24/22 11:13 AM

## 2022-12-29 ENCOUNTER — Encounter (HOSPITAL_BASED_OUTPATIENT_CLINIC_OR_DEPARTMENT_OTHER): Payer: HMO | Admitting: Physical Therapy

## 2022-12-31 ENCOUNTER — Ambulatory Visit (HOSPITAL_BASED_OUTPATIENT_CLINIC_OR_DEPARTMENT_OTHER): Payer: HMO | Admitting: Physical Therapy

## 2022-12-31 ENCOUNTER — Encounter (HOSPITAL_BASED_OUTPATIENT_CLINIC_OR_DEPARTMENT_OTHER): Payer: Self-pay | Admitting: Physical Therapy

## 2022-12-31 DIAGNOSIS — M542 Cervicalgia: Secondary | ICD-10-CM

## 2022-12-31 DIAGNOSIS — M62830 Muscle spasm of back: Secondary | ICD-10-CM

## 2022-12-31 NOTE — Therapy (Signed)
OUTPATIENT PHYSICAL THERAPY TREATMENT   Patient Name: Eric Cochran MRN: [id number] DOB:[birth year], 72 y.o., male Today's Date: 12/31/2022  END OF SESSION:  PT End of Session - 12/31/22 1058     Visit Number 15    Number of Visits 21    Date for PT Re-Evaluation 01/17/23    Authorization Type HTA    PT Start Time 1100    PT Stop Time 1142    PT Time Calculation (min) 42 min    Activity Tolerance Patient tolerated treatment well    Behavior During Therapy WFL for tasks assessed/performed                    Past Medical History:  Diagnosis Date   Arthritis    Obesity (BMI 30-39.9)    Obstructive sleep apnea    severe- on VPAP auto    Perirectal abscess    Past Surgical History:  Procedure Laterality Date   ANKLE SURGERY     BACK SURGERY     HERNIA REPAIR     umb hernia   INCISION AND DRAINAGE PERIRECTAL ABSCESS  04/08/2011   Procedure: IRRIGATION AND DEBRIDEMENT PERIRECTAL ABSCESS;  Surgeon: Clovis Pu. Cornett, MD;  Location: [facility] OR;  Service: General;  Laterality: N/A;   LIGAMENT REPAIR     right arm ulna   NASAL SINUS SURGERY     Patient Active Problem List   Diagnosis Date Noted   Obstructive sleep apnea    Obesity (BMI 30-39.9)      REFERRING PROVIDER:  Dawley, Terre Zabriskie Mulder, DO    REFERRING DIAG: M54.2 (ICD-10-CM) - Cervicalgia   Dry Needling  Rationale for Evaluation and Treatment: Rehabilitation  THERAPY DIAG:  Cervicalgia  Muscle spasm of back  ONSET DATE: subacute on chronic   SUBJECTIVE:                                                                                                                                                                                           SUBJECTIVE STATEMENT: Pt states he has had a flair of sciatica this morning. The pain goes from buttock to lateral ankle. No particular injury. Had to sit on ice this morning for an hour.   PERTINENT HISTORY:  L5 S1 fusion bony, L3-4 plate fusion  PAIN:  Are you  having pain? Yes: NPRS scale: 6/10 Pain location: low back  Pain description: spasms, general discomfort Aggravating factors: looking up Relieving factors: ice  PRECAUTIONS:  None  RED FLAGS: None   WEIGHT BEARING RESTRICTIONS:  No  FALLS:  Has patient fallen in last 6 months? 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  DIAGNOSTIC FINDINGS:  No recent imaging available  PATIENT SURVEYS:  FOTO 50  11/11 FOTO 61  POSTURE:  forward head, decreased lumbar lordosis, and decreased thoracic kyphosis  HAND DOMINANCE:  Right   Body Part #1 Cervical  PALPATION: Eval: Passive range of motion greater than active range of motion, soft stretchy end feel  CERVICAL ROM:   Active ROM A/PROM (deg) eval 11/13/22 10/14 11/20/22 11/4 11/11  Flexion 18 35  41  50  Extension 22 35  42  42  Right lateral flexion 14 20  24  improves to 25 26 27   Left lateral flexion 18 18  19  improves to 25 26 28   Right rotation 50 52 62 after manual 64 improved to 73 66 68  Left rotation 52 55 62 after manual 70 improved to 73 68 69   (Blank rows = not tested)   TREATMENT:          11/27  STM R posterior hip, passive release  Supine nerve glide 2x10 Supine piriformis 4x 10s each Supine hip ABD iso 2x3 10s   Thermotherapy, active recovery management   11/20  STM and R UT and L/S  Seated table T/S ext 10x  Cable row 3x8 35lbs Cable shoulder extension 20lbs 2x10 Cable face pull 20lbs 2x10  11/18  CPA and UPA T3-12 grade III-IV  STM T/S paraspinals  Seated T/S ext 10x 3s  Open book 10x each side 5s Seated T/S ext with towel roll 5s 2x10    11/13  Trigger Point Dry-Needling  Treatment instructions: Expect mild to moderate muscle soreness. S/S of pneumothorax if dry  needled over a lung field, and to seek immediate medical attention should they occur. Patient verbalized understanding of these instructions and education.   Patient Consent Given: Yes Education (verbally/handout)provided: Yes Muscles treated: bilat L3-4 paraspinals with shelfing technique; R C4-7 paraspinals with shelfing Electrical stimulation performed: no Parameters:   N/A Treatment response/outcome: twitch response, elicited, decrease in soft tissue tension  STM bilat QL and L1-5 paraspinals Prone R UPA grade III-IV R C3-6  Curl up 3s 2x10 Self STM techniques Rocabado exercises for jaw pain (hand out provided) Postural changes to reduce TMD; chin tucking, jaw neutral resting position   Treatment                            11/11  Trigger Point Dry-Needling  Treatment instructions: Expect mild to moderate muscle soreness. S/S of pneumothorax if dry needled over a lung field, and to seek immediate medical attention should they occur. Patient verbalized understanding of these instructions and education.   Patient Consent Given: Yes Education (verbally/handout)provided: Yes Muscles treated: bilat L3-4 paraspinals with shelfing technique Electrical stimulation performed: no Parameters:   N/A Treatment response/outcome: twitch response, elicited, decrease in soft tissue tension  STM bilat QL and L1-5 paraspinals  STM L UT, bilat cervical paraspinals UPA grade III-IV bilat C3-6, inf medial glide grade IV same levels Manual traction    Treatment                            11/6  Trigger Point Dry-Needling  Treatment instructions: Expect mild to moderate  muscle soreness. S/S of pneumothorax if dry needled over a lung field, and to seek immediate medical attention should they occur. Patient verbalized understanding of these instructions and education.   Patient Consent Given: Yes Education (verbally/handout)provided: Yes Muscles treated: bilat L3-4 paraspinals with shelfing  technique Electrical stimulation performed: no Parameters:   N/A Treatment response/outcome: twitch response, elicited, decrease in soft tissue tension  STM bilat QL and L1-5 paraspinals  Review of hip hinging with dowel at wall and to chair Lifting mechanics discussion   Treatment                            11/4:  Trigger Point Dry Needling, Manual Therapy Treatment:  Initial or subsequent education regarding Trigger Point Dry Needling: Subsequent Did patient give consent to treatment with Trigger Point Dry Needling: Yes TPDN with skilled palpation and monitoring followed by STM to the following muscles: Rt glut max/med, Lt thoracic paraspinals followed by mid-thoracic rib mobs bilaterally  Seated piriformis stretch Standing squat deadlift with blue tband Seated hip hinge with bar- progressed to sit<>stand, to table taps Seated hamstring stretch Standing horizontal abduction lift from wall    10/28 STM L UT, bilat cervical paraspinals UPA grade III-IV bilat C3-6, inf medial glide grade IV same levels Mnaula traction Manual traction with chin tuck 2x10  Supine chin tuck with rotation 2x10 RTB shrug with head tilt (loaded eccentric, light) 2x10 SNAG cervical rotation with pillowcase 2x10 bilat                                                                                                                       11/27/22 Manual: STM to cervical paraspinals, Grade II-III R and L UPA C3-C7, manual traction, L lateral glide C3-C7, UT stretch Prone shoulder extension 3# 2 x 15 Prone shoulder horizontal abduction 3# 2 x 10  Open book short lever arm shoulder 1 x 10 with 5 seconds bilateral    Treatment                            11/24/22:  Trigger Point Dry Needling, Manual Therapy Treatment:  Initial or subsequent education regarding Trigger Point Dry Needling: Subsequent Did patient give consent to treatment with Trigger Point Dry Needling: Yes TPDN with skilled palpation and  monitoring followed by STM to the following muscles: Lt upper trap, levator, rhomboid  Prone retraction, + GHJ ext Rt sidelying resisted scap protraction/retraction Sidelying Lt ER  Sidelying Lt scapular mobilizations & distraction Supine cervical distraction, bil first rib mobilization, suboccipital release, C0 on C1 AP mobs 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-1 knee on table, ipsilat foot off table and on floor     PATIENT EDUCATION:  Education details: Teacher, music of condition, POC, HEP, exercise form/rationale Person educated: Patient and Spouse Education method: Explanation, Facilities manager, Actor cues, Verbal cues, and Handouts Education comprehension: verbalized understanding, returned demonstration, verbal cues required,  tactile cues required, and needs further education  HOME EXERCISE PROGRAM: [id number]   ASSESSMENT:  CLINICAL IMPRESSION: Pt with acute flair of radiating posterior R hip pain that limits functional gait and mobility. Pt had reduction of pain from 6/10 to 4-5/10 at baseline. Pt given HEP for management of acute pain and advised on active recovery and reduction of static positioning to help with nerve desensitization. Re-check at next visit and modify treatment session as needed. Consider lumbopelvic additions at next if able to do more gym based exercise. Pt would benefit from continued skilled therapy in order to reach goals and maximize functional cervical and lumboelvic strength and ROM for prevention of further functional decline.   REHAB POTENTIAL: Good  CLINICAL DECISION MAKING: Stable/uncomplicated  EVALUATION COMPLEXITY: Low   GOALS: Goals reviewed with patient? Yes  SHORT TERM GOALS: Target date: 10/26  Able to demo proper resting posture with awareness of correction of forward head.  Baseline: Goal status: achieved 10/21    LONG TERM GOALS: Target date: POC date  Meet FOTO goal Baseline:  Goal status: ongonig  2.  AROM cervical  rotation to 60 deg bil without discomfort Baseline:  met  3.  Able to participate in activities without limitation by spasm Baseline:  Goal status: ongoing  4.  Bil GHJ ER to 5/5 Baseline:  Goal status: ongoiing   PLAN:  PT FREQUENCY: 1-2x/week  PT DURATION: 10 weeks  PLANNED INTERVENTIONS: Therapeutic exercises, Therapeutic activity, Neuromuscular re-education, Patient/Family education, Self Care, Joint mobilization, Stair training, Aquatic Therapy, Dry Needling, Electrical stimulation, Spinal mobilization, Cryotherapy, Moist heat, Taping, Ionotophoresis 4mg /ml Dexamethasone, Manual therapy, and Re-evaluation.  PLAN FOR NEXT SESSION: DN PRN, deep neck flexor & periscap strengthening.   Zebedee Iba PT, DPT 12/31/22 11:47 AM

## 2023-01-05 ENCOUNTER — Encounter (HOSPITAL_BASED_OUTPATIENT_CLINIC_OR_DEPARTMENT_OTHER): Payer: Self-pay | Admitting: Physical Therapy

## 2023-01-05 ENCOUNTER — Ambulatory Visit (HOSPITAL_BASED_OUTPATIENT_CLINIC_OR_DEPARTMENT_OTHER): Payer: HMO | Attending: Neurological Surgery | Admitting: Physical Therapy

## 2023-01-05 DIAGNOSIS — M542 Cervicalgia: Secondary | ICD-10-CM | POA: Insufficient documentation

## 2023-01-05 DIAGNOSIS — M62830 Muscle spasm of back: Secondary | ICD-10-CM | POA: Diagnosis not present

## 2023-01-05 NOTE — Therapy (Signed)
OUTPATIENT PHYSICAL THERAPY TREATMENT   Patient Name: Eric Cochran MRN: [id number] DOB:1951-01-23, 72 y.o., male Today's Date: 01/05/2023  END OF SESSION:  PT End of Session - 01/05/23 0928     Visit Number 16    Number of Visits 21    Date for PT Re-Evaluation 01/17/23    Authorization Type HTA    PT Start Time 0930    PT Stop Time 1010    PT Time Calculation (min) 40 min    Activity Tolerance Patient tolerated treatment well    Behavior During Therapy WFL for tasks assessed/performed                     Past Medical History:  Diagnosis Date   Arthritis    Obesity (BMI 30-39.9)    Obstructive sleep apnea    severe- on VPAP auto    Perirectal abscess    Past Surgical History:  Procedure Laterality Date   ANKLE SURGERY     BACK SURGERY     HERNIA REPAIR     umb hernia   INCISION AND DRAINAGE PERIRECTAL ABSCESS  04/08/2011   Procedure: IRRIGATION AND DEBRIDEMENT PERIRECTAL ABSCESS;  Surgeon: Clovis Pu. Cornett, MD;  Location: [facility] OR;  Service: General;  Laterality: N/A;   LIGAMENT REPAIR     right arm ulna   NASAL SINUS SURGERY     Patient Active Problem List   Diagnosis Date Noted   Obstructive sleep apnea    Obesity (BMI 30-39.9)      REFERRING PROVIDER:  Dawley, Halsey Hammen Mulder, DO    REFERRING DIAG: M54.2 (ICD-10-CM) - Cervicalgia   Dry Needling  Rationale for Evaluation and Treatment: Rehabilitation  THERAPY DIAG:  Cervicalgia  Muscle spasm of back  ONSET DATE: subacute on chronic   SUBJECTIVE:                                                                                                                                                                                           SUBJECTIVE STATEMENT: Pt states that the sciatica is better. He is in much less pain today and only feels it in the calf.   PERTINENT HISTORY:  L5 S1 fusion bony, L3-4 plate fusion  PAIN:  Are you having pain? Yes: NPRS scale: 3/10 Pain location: low back   Pain description: spasms, general discomfort Aggravating factors: looking up Relieving factors: ice  PRECAUTIONS:  None  RED FLAGS: None   WEIGHT BEARING RESTRICTIONS:  No  FALLS:  Has patient fallen in last 6 months? 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  DIAGNOSTIC FINDINGS:  No recent imaging available  PATIENT SURVEYS:  FOTO 50  11/11 FOTO 61  POSTURE:  forward head, decreased lumbar lordosis, and decreased thoracic kyphosis  HAND DOMINANCE:  Right   Body Part #1 Cervical  PALPATION: Eval: Passive range of motion greater than active range of motion, soft stretchy end feel  CERVICAL ROM:   Active ROM A/PROM (deg) eval 11/13/22 10/14 11/20/22 11/4 11/11  Flexion 18 35  41  50  Extension 22 35  42  42  Right lateral flexion 14 20  24  improves to 25 26 27   Left lateral flexion 18 18  19  improves to 25 26 28   Right rotation 50 52 62 after manual 64 improved to 73 66 68  Left rotation 52 55 62 after manual 70 improved to 73 68 69   (Blank rows = not tested)   TREATMENT:          12/2   STM R posterior hip, passive release; STM bilat QL   Supine nerve glide 2x10 5lb goblet STS 4x6-8 Supine hip ABD with bridge 2x10 2s YTB sidestepping 2x hallway lap YTB retro step 2x hallway laps   11/27  STM R posterior hip, passive release  Supine nerve glide 2x10 Supine piriformis 4x 10s each Supine hip ABD iso 2x3 10s   Thermotherapy, active recovery management   11/20  STM and R UT and L/S  Seated table T/S ext 10x  Cable row 3x8 35lbs Cable shoulder extension 20lbs 2x10 Cable face pull 20lbs 2x10  11/18  CPA and UPA T3-12 grade III-IV  STM T/S paraspinals  Seated T/S ext 10x 3s  Open book 10x each side 5s Seated T/S ext  with towel roll 5s 2x10    11/13  Trigger Point Dry-Needling  Treatment instructions: Expect mild to moderate muscle soreness. S/S of pneumothorax if dry needled over a lung field, and to seek immediate medical attention should they occur. Patient verbalized understanding of these instructions and education.   Patient Consent Given: Yes Education (verbally/handout)provided: Yes Muscles treated: bilat L3-4 paraspinals with shelfing technique; R C4-7 paraspinals with shelfing Electrical stimulation performed: no Parameters:   N/A Treatment response/outcome: twitch response, elicited, decrease in soft tissue tension  STM bilat QL and L1-5 paraspinals Prone R UPA grade III-IV R C3-6  Curl up 3s 2x10 Self STM techniques Rocabado exercises for jaw pain (hand out provided) Postural changes to reduce TMD; chin tucking, jaw neutral resting position   Treatment                            11/11  Trigger Point Dry-Needling  Treatment instructions: Expect mild to moderate muscle soreness. S/S of pneumothorax if dry needled over a lung field, and to seek immediate medical attention should they occur. Patient verbalized understanding of these instructions and education.   Patient Consent Given: Yes Education (verbally/handout)provided: Yes Muscles treated: bilat L3-4 paraspinals with shelfing technique Electrical stimulation performed: no Parameters:   N/A Treatment response/outcome: twitch response, elicited, decrease in soft tissue tension  STM bilat QL and L1-5 paraspinals  STM L UT, bilat cervical paraspinals UPA grade III-IV bilat C3-6, inf medial glide grade IV same levels Manual traction    Treatment  11/6  Trigger Point Dry-Needling  Treatment instructions: Expect mild to moderate muscle soreness. S/S of pneumothorax if dry needled over a lung field, and to seek immediate medical attention should they occur. Patient verbalized understanding of these  instructions and education.   Patient Consent Given: Yes Education (verbally/handout)provided: Yes Muscles treated: bilat L3-4 paraspinals with shelfing technique Electrical stimulation performed: no Parameters:   N/A Treatment response/outcome: twitch response, elicited, decrease in soft tissue tension  STM bilat QL and L1-5 paraspinals  Review of hip hinging with dowel at wall and to chair Lifting mechanics discussion   Treatment                            11/4:  Trigger Point Dry Needling, Manual Therapy Treatment:  Initial or subsequent education regarding Trigger Point Dry Needling: Subsequent Did patient give consent to treatment with Trigger Point Dry Needling: Yes TPDN with skilled palpation and monitoring followed by STM to the following muscles: Rt glut max/med, Lt thoracic paraspinals followed by mid-thoracic rib mobs bilaterally  Seated piriformis stretch Standing squat deadlift with blue tband Seated hip hinge with bar- progressed to sit<>stand, to table taps Seated hamstring stretch Standing horizontal abduction lift from wall    10/28 STM L UT, bilat cervical paraspinals UPA grade III-IV bilat C3-6, inf medial glide grade IV same levels Mnaula traction Manual traction with chin tuck 2x10  Supine chin tuck with rotation 2x10 RTB shrug with head tilt (loaded eccentric, light) 2x10 SNAG cervical rotation with pillowcase 2x10 bilat                                                                                                                       11/27/22 Manual: STM to cervical paraspinals, Grade II-III R and L UPA C3-C7, manual traction, L lateral glide C3-C7, UT stretch Prone shoulder extension 3# 2 x 15 Prone shoulder horizontal abduction 3# 2 x 10  Open book short lever arm shoulder 1 x 10 with 5 seconds bilateral    Treatment                            11/24/22:  Trigger Point Dry Needling, Manual Therapy Treatment:  Initial or subsequent education  regarding Trigger Point Dry Needling: Subsequent Did patient give consent to treatment with Trigger Point Dry Needling: Yes TPDN with skilled palpation and monitoring followed by STM to the following muscles: Lt upper trap, levator, rhomboid  Prone retraction, + GHJ ext Rt sidelying resisted scap protraction/retraction Sidelying Lt ER  Sidelying Lt scapular mobilizations & distraction Supine cervical distraction, bil first rib mobilization, suboccipital release, C0 on C1 AP mobs 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-1 knee on table, ipsilat foot off table and on floor     PATIENT EDUCATION:  Education details: Teacher, music of condition, POC, HEP, exercise form/rationale Person educated: Patient and Spouse Education method: Explanation, Demonstration, Actor cues, Verbal  cues, and Handouts Education comprehension: verbalized understanding, returned demonstration, verbal cues required, tactile cues required, and needs further education  HOME EXERCISE PROGRAM: [id number]   ASSESSMENT:  CLINICAL IMPRESSION: Pt with signficant decrease in sciatic related pain from previous session and has improved functional gait and mobility with much less antalgic gait pattern. Transfers and general lumbar mobility has increased in fluidity since last. Pt able to start more active resisted exercise without exacerbation. Pt's pain is centralizing and is continuing to gradually improve. Consider lumbopelvic additions at next if able to do more gym based exercise. Pt would benefit from continued skilled therapy in order to reach goals and maximize functional cervical and lumboelvic strength and ROM for prevention of further functional decline.   REHAB POTENTIAL: Good  CLINICAL DECISION MAKING: Stable/uncomplicated  EVALUATION COMPLEXITY: Low   GOALS: Goals reviewed with patient? Yes  SHORT TERM GOALS: Target date: 10/26  Able to demo proper resting posture with awareness of correction of forward  head.  Baseline: Goal status: achieved 10/21    LONG TERM GOALS: Target date: POC date  Meet FOTO goal Baseline:  Goal status: ongonig  2.  AROM cervical rotation to 60 deg bil without discomfort Baseline:  met  3.  Able to participate in activities without limitation by spasm Baseline:  Goal status: ongoing  4.  Bil GHJ ER to 5/5 Baseline:  Goal status: ongoiing   PLAN:  PT FREQUENCY: 1-2x/week  PT DURATION: 10 weeks  PLANNED INTERVENTIONS: Therapeutic exercises, Therapeutic activity, Neuromuscular re-education, Patient/Family education, Self Care, Joint mobilization, Stair training, Aquatic Therapy, Dry Needling, Electrical stimulation, Spinal mobilization, Cryotherapy, Moist heat, Taping, Ionotophoresis 4mg /ml Dexamethasone, Manual therapy, and Re-evaluation.  PLAN FOR NEXT SESSION: DN PRN, deep neck flexor & periscap strengthening.   Zebedee Iba PT, DPT 01/05/23 10:15 AM

## 2023-01-07 ENCOUNTER — Encounter (HOSPITAL_BASED_OUTPATIENT_CLINIC_OR_DEPARTMENT_OTHER): Payer: Self-pay | Admitting: Physical Therapy

## 2023-01-07 ENCOUNTER — Ambulatory Visit (HOSPITAL_BASED_OUTPATIENT_CLINIC_OR_DEPARTMENT_OTHER): Payer: HMO | Admitting: Physical Therapy

## 2023-01-07 DIAGNOSIS — M542 Cervicalgia: Secondary | ICD-10-CM | POA: Diagnosis not present

## 2023-01-07 DIAGNOSIS — M62830 Muscle spasm of back: Secondary | ICD-10-CM

## 2023-01-07 NOTE — Therapy (Signed)
OUTPATIENT PHYSICAL THERAPY TREATMENT   Patient Name: Eric Cochran MRN: [id number] DOB:[birth year], 72 y.o., male Today's Date: 01/07/2023  END OF SESSION:  PT End of Session - 01/07/23 0808     Visit Number 17    Number of Visits 21    Date for PT Re-Evaluation 01/17/23    Authorization Type HTA    PT Start Time 0802    PT Stop Time 0840    PT Time Calculation (min) 38 min    Activity Tolerance Patient tolerated treatment well    Behavior During Therapy WFL for tasks assessed/performed                      Past Medical History:  Diagnosis Date   Arthritis    Obesity (BMI 30-39.9)    Obstructive sleep apnea    severe- on VPAP auto    Perirectal abscess    Past Surgical History:  Procedure Laterality Date   ANKLE SURGERY     BACK SURGERY     HERNIA REPAIR     umb hernia   INCISION AND DRAINAGE PERIRECTAL ABSCESS  04/08/2011   Procedure: IRRIGATION AND DEBRIDEMENT PERIRECTAL ABSCESS;  Surgeon: Clovis Pu. Cornett, MD;  Location: [facility] OR;  Service: General;  Laterality: N/A;   LIGAMENT REPAIR     right arm ulna   NASAL SINUS SURGERY     Patient Active Problem List   Diagnosis Date Noted   Obstructive sleep apnea    Obesity (BMI 30-39.9)      REFERRING PROVIDER:  Dawley, Troy C, DO    REFERRING DIAG: M54.2 (ICD-10-CM) - Cervicalgia   Dry Needling  Rationale for Evaluation and Treatment: Rehabilitation  THERAPY DIAG:  Cervicalgia  Muscle spasm of back  ONSET DATE: subacute on chronic   SUBJECTIVE:                                                                                                                                                                                           SUBJECTIVE STATEMENT: Pt states that the pain is lower in intensity but feels it into the ankle.   PERTINENT HISTORY:  L5 S1 fusion bony, L3-4 plate fusion  PAIN:  Are you having pain? Yes: NPRS scale: 2/10 Pain location: R lateral ankle Pain description:  spasms, general discomfort Aggravating factors: looking up Relieving factors: ice  PRECAUTIONS:  None  RED FLAGS: None   WEIGHT BEARING RESTRICTIONS:  No  FALLS:  Has patient fallen in last 6 months? 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  DIAGNOSTIC FINDINGS:  No recent imaging available  PATIENT SURVEYS:  FOTO 50  11/11 FOTO 61  POSTURE:  forward head, decreased lumbar lordosis, and decreased thoracic kyphosis  HAND DOMINANCE:  Right   Body Part #1 Cervical  PALPATION: Eval: Passive range of motion greater than active range of motion, soft stretchy end feel  CERVICAL ROM:   Active ROM A/PROM (deg) eval 11/13/22 10/14 11/20/22 11/4 11/11  Flexion 18 35  41  50  Extension 22 35  42  42  Right lateral flexion 14 20  24  improves to 25 26 27   Left lateral flexion 18 18  19  improves to 25 26 28   Right rotation 50 52 62 after manual 64 improved to 73 66 68  Left rotation 52 55 62 after manual 70 improved to 73 68 69   (Blank rows = not tested)   TREATMENT:           12/4  Nustep warm up 5 min lvl 5  nerve glide 2x10 seated with ankle IV for L4-5 Supine bridge with march 2x20 (up the entire time) Bird dog 2x20 Cable hip hinge/ext 10x 15lbs, 2x10 30lbs  Cable row 3x8 27.5lbs (unilateral cable)  YTB sidestepping 2x hallway lap Shuttle leg press (set up edu) 125 lbs 3x10    12/2   STM R posterior hip, passive release; STM bilat QL   Supine nerve glide 2x10 5lb goblet STS 4x6-8 Supine hip ABD with bridge 2x10 2s YTB sidestepping 2x hallway lap YTB retro step 2x hallway laps   11/27  STM R posterior hip, passive release  Supine nerve glide 2x10 Supine piriformis 4x 10s each Supine hip ABD iso 2x3 10s   Thermotherapy, active  recovery management   11/20  STM and R UT and L/S  Seated table T/S ext 10x  Cable row 3x8 35lbs Cable shoulder extension 20lbs 2x10 Cable face pull 20lbs 2x10  11/18  CPA and UPA T3-12 grade III-IV  STM T/S paraspinals  Seated T/S ext 10x 3s  Open book 10x each side 5s Seated T/S ext with towel roll 5s 2x10    11/13  Trigger Point Dry-Needling  Treatment instructions: Expect mild to moderate muscle soreness. S/S of pneumothorax if dry needled over a lung field, and to seek immediate medical attention should they occur. Patient verbalized understanding of these instructions and education.   Patient Consent Given: Yes Education (verbally/handout)provided: Yes Muscles treated: bilat L3-4 paraspinals with shelfing technique; R C4-7 paraspinals with shelfing Electrical stimulation performed: no Parameters:   N/A Treatment response/outcome: twitch response, elicited, decrease in soft tissue tension  STM bilat QL and L1-5 paraspinals Prone R UPA grade III-IV R C3-6  Curl up 3s 2x10 Self STM techniques Rocabado exercises for jaw pain (hand out provided) Postural changes to reduce TMD; chin tucking, jaw neutral resting position   Treatment                            11/11  Trigger Point Dry-Needling  Treatment instructions: Expect mild to moderate muscle soreness. S/S of pneumothorax if dry needled over a lung field, and to seek immediate medical attention should they occur. Patient verbalized understanding of these instructions and education.   Patient Consent Given: Yes Education (verbally/handout)provided: Yes Muscles treated: bilat L3-4 paraspinals with shelfing  technique Electrical stimulation performed: no Parameters:   N/A Treatment response/outcome: twitch response, elicited, decrease in soft tissue tension  STM bilat QL and L1-5 paraspinals  STM L UT, bilat cervical paraspinals UPA grade III-IV bilat C3-6, inf medial glide grade IV same levels Manual  traction    Treatment                            11/6  Trigger Point Dry-Needling  Treatment instructions: Expect mild to moderate muscle soreness. S/S of pneumothorax if dry needled over a lung field, and to seek immediate medical attention should they occur. Patient verbalized understanding of these instructions and education.   Patient Consent Given: Yes Education (verbally/handout)provided: Yes Muscles treated: bilat L3-4 paraspinals with shelfing technique Electrical stimulation performed: no Parameters:   N/A Treatment response/outcome: twitch response, elicited, decrease in soft tissue tension  STM bilat QL and L1-5 paraspinals  Review of hip hinging with dowel at wall and to chair Lifting mechanics discussion   Treatment                            11/4:  Trigger Point Dry Needling, Manual Therapy Treatment:  Initial or subsequent education regarding Trigger Point Dry Needling: Subsequent Did patient give consent to treatment with Trigger Point Dry Needling: Yes TPDN with skilled palpation and monitoring followed by STM to the following muscles: Rt glut max/med, Lt thoracic paraspinals followed by mid-thoracic rib mobs bilaterally  Seated piriformis stretch Standing squat deadlift with blue tband Seated hip hinge with bar- progressed to sit<>stand, to table taps Seated hamstring stretch Standing horizontal abduction lift from wall    10/28 STM L UT, bilat cervical paraspinals UPA grade III-IV bilat C3-6, inf medial glide grade IV same levels Mnaula traction Manual traction with chin tuck 2x10  Supine chin tuck with rotation 2x10 RTB shrug with head tilt (loaded eccentric, light) 2x10 SNAG cervical rotation with pillowcase 2x10 bilat                                                                                                                       11/27/22 Manual: STM to cervical paraspinals, Grade II-III R and L UPA C3-C7, manual traction, L lateral glide  C3-C7, UT stretch Prone shoulder extension 3# 2 x 15 Prone shoulder horizontal abduction 3# 2 x 10  Open book short lever arm shoulder 1 x 10 with 5 seconds bilateral    Treatment                            11/24/22:  Trigger Point Dry Needling, Manual Therapy Treatment:  Initial or subsequent education regarding Trigger Point Dry Needling: Subsequent Did patient give consent to treatment with Trigger Point Dry Needling: Yes TPDN with skilled palpation and monitoring followed by STM to the following muscles: Lt upper trap, levator, rhomboid  Prone retraction, +  GHJ ext Rt sidelying resisted scap protraction/retraction Sidelying Lt ER  Sidelying Lt scapular mobilizations & distraction Supine cervical distraction, bil first rib mobilization, suboccipital release, C0 on C1 AP mobs 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-1 knee on table, ipsilat foot off table and on floor     PATIENT EDUCATION:  Education details: Teacher, music of condition, POC, HEP, exercise form/rationale Person educated: Patient and Spouse Education method: Programmer, multimedia, Demonstration, Actor cues, Verbal cues, and Handouts Education comprehension: verbalized understanding, returned demonstration, verbal cues required, tactile cues required, and needs further education  HOME EXERCISE PROGRAM: [id number]   ASSESSMENT:  CLINICAL IMPRESSION: Pt able to continue with progression of lumbopelvic exercise and machine based strength today without pain. Able to continue with progression of hip extension exercise and general LE exercise at higher RPE for abdominal engagement. Edu provided on safe machine set up. Continue with progression of gym based exercise for D/C transition.  Pt would benefit from continued skilled therapy in order to reach goals and maximize functional cervical and lumboelvic strength and ROM for prevention of further functional decline.   REHAB POTENTIAL: Good  CLINICAL DECISION MAKING:  Stable/uncomplicated  EVALUATION COMPLEXITY: Low   GOALS: Goals reviewed with patient? Yes  SHORT TERM GOALS: Target date: 10/26  Able to demo proper resting posture with awareness of correction of forward head.  Baseline: Goal status: achieved 10/21    LONG TERM GOALS: Target date: POC date  Meet FOTO goal Baseline:  Goal status: ongonig  2.  AROM cervical rotation to 60 deg bil without discomfort Baseline:  met  3.  Able to participate in activities without limitation by spasm Baseline:  Goal status: ongoing  4.  Bil GHJ ER to 5/5 Baseline:  Goal status: ongoiing   PLAN:  PT FREQUENCY: 1-2x/week  PT DURATION: 10 weeks  PLANNED INTERVENTIONS: Therapeutic exercises, Therapeutic activity, Neuromuscular re-education, Patient/Family education, Self Care, Joint mobilization, Stair training, Aquatic Therapy, Dry Needling, Electrical stimulation, Spinal mobilization, Cryotherapy, Moist heat, Taping, Ionotophoresis 4mg /ml Dexamethasone, Manual therapy, and Re-evaluation.  PLAN FOR NEXT SESSION: DN PRN, deep neck flexor & periscap strengthening.   Zebedee Iba PT, DPT 01/07/23 8:42 AM

## 2023-01-12 ENCOUNTER — Ambulatory Visit (HOSPITAL_BASED_OUTPATIENT_CLINIC_OR_DEPARTMENT_OTHER): Payer: HMO | Admitting: Physical Therapy

## 2023-01-12 ENCOUNTER — Encounter (HOSPITAL_BASED_OUTPATIENT_CLINIC_OR_DEPARTMENT_OTHER): Payer: Self-pay | Admitting: Physical Therapy

## 2023-01-12 DIAGNOSIS — M62830 Muscle spasm of back: Secondary | ICD-10-CM

## 2023-01-12 DIAGNOSIS — M542 Cervicalgia: Secondary | ICD-10-CM | POA: Diagnosis not present

## 2023-01-12 DIAGNOSIS — R972 Elevated prostate specific antigen [PSA]: Secondary | ICD-10-CM | POA: Diagnosis not present

## 2023-01-12 NOTE — Therapy (Signed)
OUTPATIENT PHYSICAL THERAPY TREATMENT   Patient Name: Eric Cochran MRN: [id number] DOB:12-Jul-1950, 72 y.o., male Today's Date: 01/12/2023  END OF SESSION:  PT End of Session - 01/12/23 1012     Visit Number 18    Number of Visits 21    Date for PT Re-Evaluation 01/17/23    Authorization Type HTA    PT Start Time 1015    PT Stop Time 1057    PT Time Calculation (min) 42 min    Activity Tolerance Patient tolerated treatment well    Behavior During Therapy WFL for tasks assessed/performed                      Past Medical History:  Diagnosis Date   Arthritis    Obesity (BMI 30-39.9)    Obstructive sleep apnea    severe- on VPAP auto    Perirectal abscess    Past Surgical History:  Procedure Laterality Date   ANKLE SURGERY     BACK SURGERY     HERNIA REPAIR     umb hernia   INCISION AND DRAINAGE PERIRECTAL ABSCESS  04/08/2011   Procedure: IRRIGATION AND DEBRIDEMENT PERIRECTAL ABSCESS;  Surgeon: Clovis Pu. Cornett, MD;  Location: [facility] OR;  Service: General;  Laterality: N/A;   LIGAMENT REPAIR     right arm ulna   NASAL SINUS SURGERY     Patient Active Problem List   Diagnosis Date Noted   Obstructive sleep apnea    Obesity (BMI 30-39.9)      REFERRING PROVIDER:  Dawley, Troy C, DO    REFERRING DIAG: M54.2 (ICD-10-CM) - Cervicalgia   Dry Needling  Rationale for Evaluation and Treatment: Rehabilitation  THERAPY DIAG:  Cervicalgia  Muscle spasm of back  ONSET DATE: subacute on chronic   SUBJECTIVE:                                                                                                                                                                                           SUBJECTIVE STATEMENT: Pt states that due to the weather, the sciatica pain has increased and is all the way down to the ankle. Pt states that the pain woke him up this morning.   PERTINENT HISTORY:  L5 S1 fusion bony, L3-4 plate fusion  PAIN:  Are you having  pain? Yes: NPRS scale: 4/10 Pain location: R lateral ankle and into the knee  Pain description: spasms, general discomfort Aggravating factors: looking up Relieving factors: ice  PRECAUTIONS:  None  RED FLAGS: None   WEIGHT BEARING RESTRICTIONS:  No  FALLS:  Has patient fallen in last  6 months? 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  DIAGNOSTIC FINDINGS:  No recent imaging available  PATIENT SURVEYS:  FOTO 50  11/11 FOTO 61  POSTURE:  forward head, decreased lumbar lordosis, and decreased thoracic kyphosis  HAND DOMINANCE:  Right   Body Part #1 Cervical  PALPATION: Eval: Passive range of motion greater than active range of motion, soft stretchy end feel  CERVICAL ROM:   Active ROM A/PROM (deg) eval 11/13/22 10/14 11/20/22 11/4 11/11  Flexion 18 35  41  50  Extension 22 35  42  42  Right lateral flexion 14 20  24  improves to 25 26 27   Left lateral flexion 18 18  19  improves to 25 26 28   Right rotation 50 52 62 after manual 64 improved to 73 66 68  Left rotation 52 55 62 after manual 70 improved to 73 68 69   (Blank rows = not tested)   TREATMENT:       12/9  STM R posterior hip, passive release Lumbar grade II UPA on R STM R QL     Thermotherapy, active recovery management, self pain management, massage therapy options and practitioners   12/4  Nustep warm up 5 min lvl 5  nerve glide 2x10 seated with ankle IV for L4-5 Supine bridge with march 2x20 (up the entire time) Bird dog 2x20 Cable hip hinge/ext 10x 15lbs, 2x10 30lbs  Cable row 3x8 27.5lbs (unilateral cable)  YTB sidestepping 2x hallway lap Shuttle leg press (set up edu) 125 lbs 3x10    12/2   STM R posterior hip, passive release; STM bilat QL   Supine nerve glide  2x10 5lb goblet STS 4x6-8 Supine hip ABD with bridge 2x10 2s YTB sidestepping 2x hallway lap YTB retro step 2x hallway laps   11/27  STM R posterior hip, passive release  Supine nerve glide 2x10 Supine piriformis 4x 10s each Supine hip ABD iso 2x3 10s   Thermotherapy, active recovery management   11/20  STM and R UT and L/S  Seated table T/S ext 10x  Cable row 3x8 35lbs Cable shoulder extension 20lbs 2x10 Cable face pull 20lbs 2x10  11/18  CPA and UPA T3-12 grade III-IV  STM T/S paraspinals  Seated T/S ext 10x 3s  Open book 10x each side 5s Seated T/S ext with towel roll 5s 2x10    11/13  Trigger Point Dry-Needling  Treatment instructions: Expect mild to moderate muscle soreness. S/S of pneumothorax if dry needled over a lung field, and to seek immediate medical attention should they occur. Patient verbalized understanding of these instructions and education.   Patient Consent Given: Yes Education (verbally/handout)provided: Yes Muscles treated: bilat L3-4 paraspinals with shelfing technique; R C4-7 paraspinals with shelfing Electrical stimulation performed: no Parameters:   N/A Treatment response/outcome: twitch response, elicited, decrease in soft tissue tension  STM bilat QL and L1-5 paraspinals Prone R UPA grade III-IV R C3-6  Curl up 3s 2x10 Self STM techniques Rocabado exercises for jaw pain (hand out provided) Postural changes to reduce TMD; chin tucking, jaw neutral resting position   Treatment                            11/11  Trigger Point  Dry-Needling  Treatment instructions: Expect mild to moderate muscle soreness. S/S of pneumothorax if dry needled over a lung field, and to seek immediate medical attention should they occur. Patient verbalized understanding of these instructions and education.   Patient Consent Given: Yes Education (verbally/handout)provided: Yes Muscles treated: bilat L3-4 paraspinals with shelfing technique Electrical  stimulation performed: no Parameters:   N/A Treatment response/outcome: twitch response, elicited, decrease in soft tissue tension  STM bilat QL and L1-5 paraspinals  STM L UT, bilat cervical paraspinals UPA grade III-IV bilat C3-6, inf medial glide grade IV same levels Manual traction    Treatment                            11/6  Trigger Point Dry-Needling  Treatment instructions: Expect mild to moderate muscle soreness. S/S of pneumothorax if dry needled over a lung field, and to seek immediate medical attention should they occur. Patient verbalized understanding of these instructions and education.   Patient Consent Given: Yes Education (verbally/handout)provided: Yes Muscles treated: bilat L3-4 paraspinals with shelfing technique Electrical stimulation performed: no Parameters:   N/A Treatment response/outcome: twitch response, elicited, decrease in soft tissue tension  STM bilat QL and L1-5 paraspinals  Review of hip hinging with dowel at wall and to chair Lifting mechanics discussion   Treatment                            11/4:  Trigger Point Dry Needling, Manual Therapy Treatment:  Initial or subsequent education regarding Trigger Point Dry Needling: Subsequent Did patient give consent to treatment with Trigger Point Dry Needling: Yes TPDN with skilled palpation and monitoring followed by STM to the following muscles: Rt glut max/med, Lt thoracic paraspinals followed by mid-thoracic rib mobs bilaterally  Seated piriformis stretch Standing squat deadlift with blue tband Seated hip hinge with bar- progressed to sit<>stand, to table taps Seated hamstring stretch Standing horizontal abduction lift from wall    10/28 STM L UT, bilat cervical paraspinals UPA grade III-IV bilat C3-6, inf medial glide grade IV same levels Mnaula traction Manual traction with chin tuck 2x10  Supine chin tuck with rotation 2x10 RTB shrug with head tilt (loaded eccentric, light)  2x10 SNAG cervical rotation with pillowcase 2x10 bilat                                                                                                                       11/27/22 Manual: STM to cervical paraspinals, Grade II-III R and L UPA C3-C7, manual traction, L lateral glide C3-C7, UT stretch Prone shoulder extension 3# 2 x 15 Prone shoulder horizontal abduction 3# 2 x 10  Open book short lever arm shoulder 1 x 10 with 5 seconds bilateral    Treatment  11/24/22:  Trigger Point Dry Needling, Manual Therapy Treatment:  Initial or subsequent education regarding Trigger Point Dry Needling: Subsequent Did patient give consent to treatment with Trigger Point Dry Needling: Yes TPDN with skilled palpation and monitoring followed by STM to the following muscles: Lt upper trap, levator, rhomboid  Prone retraction, + GHJ ext Rt sidelying resisted scap protraction/retraction Sidelying Lt ER  Sidelying Lt scapular mobilizations & distraction Supine cervical distraction, bil first rib mobilization, suboccipital release, C0 on C1 AP mobs 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-1 knee on table, ipsilat foot off table and on floor     PATIENT EDUCATION:  Education details: Teacher, music of condition, POC, HEP, exercise form/rationale Person educated: Patient and Spouse Education method: Programmer, multimedia, Facilities manager, Actor cues, Verbal cues, and Handouts Education comprehension: verbalized understanding, returned demonstration, verbal cues required, tactile cues required, and needs further education  HOME EXERCISE PROGRAM: [id number]   ASSESSMENT:  CLINICAL IMPRESSION: Pt with another acute flair of radiating posterior R hip pain that limits functional gait and mobility. Pt reports this is likely due to weather related as this pattern has occurred regularly over the years. No reduction in pain today with manual. Re-check at next visit and modify treatment session as  needed. Consider TPDN of the glutes as needed at next. Pt would benefit from continued skilled therapy in order to reach goals and maximize functional cervical and lumboelvic strength and ROM for prevention of further functional decline.    REHAB POTENTIAL: Good  CLINICAL DECISION MAKING: Stable/uncomplicated  EVALUATION COMPLEXITY: Low   GOALS: Goals reviewed with patient? Yes  SHORT TERM GOALS: Target date: 10/26  Able to demo proper resting posture with awareness of correction of forward head.  Baseline: Goal status: achieved 10/21    LONG TERM GOALS: Target date: POC date  Meet FOTO goal Baseline:  Goal status: ongonig  2.  AROM cervical rotation to 60 deg bil without discomfort Baseline:  met  3.  Able to participate in activities without limitation by spasm Baseline:  Goal status: ongoing  4.  Bil GHJ ER to 5/5 Baseline:  Goal status: ongoiing   PLAN:  PT FREQUENCY: 1-2x/week  PT DURATION: 10 weeks  PLANNED INTERVENTIONS: Therapeutic exercises, Therapeutic activity, Neuromuscular re-education, Patient/Family education, Self Care, Joint mobilization, Stair training, Aquatic Therapy, Dry Needling, Electrical stimulation, Spinal mobilization, Cryotherapy, Moist heat, Taping, Ionotophoresis 4mg /ml Dexamethasone, Manual therapy, and Re-evaluation.  PLAN FOR NEXT SESSION: DN PRN, deep neck flexor & periscap strengthening.   Zebedee Iba PT, DPT 01/12/23 11:04 AM

## 2023-01-14 ENCOUNTER — Ambulatory Visit (HOSPITAL_BASED_OUTPATIENT_CLINIC_OR_DEPARTMENT_OTHER): Payer: HMO | Admitting: Physical Therapy

## 2023-01-19 ENCOUNTER — Ambulatory Visit (HOSPITAL_BASED_OUTPATIENT_CLINIC_OR_DEPARTMENT_OTHER): Payer: HMO | Admitting: Physical Therapy

## 2023-01-19 DIAGNOSIS — M62830 Muscle spasm of back: Secondary | ICD-10-CM

## 2023-01-19 DIAGNOSIS — M542 Cervicalgia: Secondary | ICD-10-CM | POA: Diagnosis not present

## 2023-01-19 DIAGNOSIS — R972 Elevated prostate specific antigen [PSA]: Secondary | ICD-10-CM | POA: Diagnosis not present

## 2023-01-19 DIAGNOSIS — R3914 Feeling of incomplete bladder emptying: Secondary | ICD-10-CM | POA: Diagnosis not present

## 2023-01-19 DIAGNOSIS — N401 Enlarged prostate with lower urinary tract symptoms: Secondary | ICD-10-CM | POA: Diagnosis not present

## 2023-01-19 NOTE — Therapy (Signed)
OUTPATIENT PHYSICAL THERAPY TREATMENT  Progress Note Reporting Period 12/15/22 to 01/19/23   See note below for Objective Data and Assessment of Progress/Goals.       Patient Name: Eric Cochran MRN: [id number] DOB:08/04/1950, 72 y.o., male Today's Date: 01/19/2023  END OF SESSION:  PT End of Session - 01/19/23 0921     Visit Number 19    Number of Visits 30    Date for PT Re-Evaluation 04/19/23    Authorization Type HTA    PT Start Time 0930    PT Stop Time 1015    PT Time Calculation (min) 45 min                      Past Medical History:  Diagnosis Date   Arthritis    Obesity (BMI 30-39.9)    Obstructive sleep apnea    severe- on VPAP auto    Perirectal abscess    Past Surgical History:  Procedure Laterality Date   ANKLE SURGERY     BACK SURGERY     HERNIA REPAIR     umb hernia   INCISION AND DRAINAGE PERIRECTAL ABSCESS  04/08/2011   Procedure: IRRIGATION AND DEBRIDEMENT PERIRECTAL ABSCESS;  Surgeon: Clovis Pu. Cornett, MD;  Location: [facility] OR;  Service: General;  Laterality: N/A;   LIGAMENT REPAIR     right arm ulna   NASAL SINUS SURGERY     Patient Active Problem List   Diagnosis Date Noted   Obstructive sleep apnea    Obesity (BMI 30-39.9)      REFERRING PROVIDER:  Dawley, Troy C, DO    REFERRING DIAG: M54.2 (ICD-10-CM) - Cervicalgia   Dry Needling  Rationale for Evaluation and Treatment: Rehabilitation  THERAPY DIAG:  Cervicalgia  Muscle spasm of back  ONSET DATE: subacute on chronic   SUBJECTIVE:                                                                                                                                                                                           SUBJECTIVE STATEMENT: Pt states that last week was very bad with the sciatica due to the weather. Pt states it is not bad today but he would like to proceed with trying to transition towards personal training. He would like to at least walk  through exercises in the gym to prepare the transition. The neck has generally improved greatly as long as he continues to keep up with HEP. He is able to do normal activity with minimal irritation or discomfort.     PERTINENT HISTORY:  L5 S1 fusion bony, L3-4 plate fusion  PAIN:  Are you having pain? Yes: NPRS scale: 4/10 Pain location: R lateral ankle and into the knee  Pain description: spasms, general discomfort Aggravating factors: looking up Relieving factors: ice  PRECAUTIONS:  None  RED FLAGS: None   WEIGHT BEARING RESTRICTIONS:  No  FALLS:  Has patient fallen in last 6 months? 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  DIAGNOSTIC FINDINGS:  No recent imaging available  PATIENT SURVEYS:  FOTO 50  11/11 FOTO 61  12/16  FOTO 66% (GOAL  MET)  ODI  Modified Oswestry Low Back Pain Disability Questionnaire: 20 / 50 = 40.0 %  POSTURE:  forward head, decreased lumbar lordosis, and decreased thoracic kyphosis  HAND DOMINANCE:  Right   Body Part #1 Cervical   CERVICAL ROM:   Active ROM A/PROM (deg) eval 11/13/22 10/14 11/20/22 11/4 11/11 12/16  Flexion 18 35  41  50 55  Extension 22 35  42  42 50  Right lateral flexion 14 20  24  improves to 25 26 27 30   Left lateral flexion 18 18  19  improves to 25 26 28 30   Right rotation 50 52 62 after manual 64 improved to 73 66 68 69  Left rotation 52 55 62 after manual 70 improved to 73 68 69 68   (Blank rows = not tested)  MMT Right 12/16  Left 12/16  Shoulder flexion 5 5  Shoulder extension    Shoulder abduction 5 5  Shoulder adduction    Shoulder internal rotation 5 5  Shoulder external rotation 5 5  (Blank rows = not tested)  LUMBAR ROM:   Active  A/PROM  12/16  Flexion 50%  Extension 10%  p!  Right lateral flexion 25%   Left lateral flexion 25%  Right rotation 25% p!  Left rotation 25% p!    (Blank rows = not tested)      TREATMENT:       12/16  Edu of exam results  Walk through of the following gym exercise: demo of technique, set up, and set/rep range; exercise precaution, indications and contra-indications  Bent over row TRX deep squat Bird dog Paloff Deadlift to box   12/9  STM R posterior hip, passive release Lumbar grade II UPA on R STM R QL     Thermotherapy, active recovery management, self pain management, massage therapy options and practitioners   12/4  Nustep warm up 5 min lvl 5  nerve glide 2x10 seated with ankle IV for L4-5 Supine bridge with march 2x20 (up the entire time) Bird dog 2x20 Cable hip hinge/ext 10x 15lbs, 2x10 30lbs  Cable row 3x8 27.5lbs (unilateral cable)  YTB sidestepping 2x hallway lap Shuttle leg press (set up edu) 125 lbs 3x10    12/2   STM R posterior hip, passive release; STM bilat QL   Supine nerve glide 2x10 5lb goblet STS 4x6-8 Supine hip ABD with bridge 2x10 2s YTB sidestepping 2x hallway lap YTB retro step 2x hallway laps   11/27  STM R posterior hip, passive release  Supine nerve glide 2x10 Supine piriformis 4x 10s each Supine hip ABD iso 2x3 10s   Thermotherapy, active recovery management   11/20  STM and R UT and L/S  Seated table T/S ext 10x  Cable  row 3x8 35lbs Cable shoulder extension 20lbs 2x10 Cable face pull 20lbs 2x10  11/18  CPA and UPA T3-12 grade III-IV  STM T/S paraspinals  Seated T/S ext 10x 3s  Open book 10x each side 5s Seated T/S ext with towel roll 5s 2x10    11/13  Trigger Point Dry-Needling  Treatment instructions: Expect mild to moderate muscle soreness. S/S of pneumothorax if dry needled over a lung field, and to seek immediate medical attention should they occur. Patient verbalized understanding of these instructions and education.   Patient  Consent Given: Yes Education (verbally/handout)provided: Yes Muscles treated: bilat L3-4 paraspinals with shelfing technique; R C4-7 paraspinals with shelfing Electrical stimulation performed: no Parameters:   N/A Treatment response/outcome: twitch response, elicited, decrease in soft tissue tension  STM bilat QL and L1-5 paraspinals Prone R UPA grade III-IV R C3-6  Curl up 3s 2x10 Self STM techniques Rocabado exercises for jaw pain (hand out provided) Postural changes to reduce TMD; chin tucking, jaw neutral resting position   Treatment                            11/11  Trigger Point Dry-Needling  Treatment instructions: Expect mild to moderate muscle soreness. S/S of pneumothorax if dry needled over a lung field, and to seek immediate medical attention should they occur. Patient verbalized understanding of these instructions and education.   Patient Consent Given: Yes Education (verbally/handout)provided: Yes Muscles treated: bilat L3-4 paraspinals with shelfing technique Electrical stimulation performed: no Parameters:   N/A Treatment response/outcome: twitch response, elicited, decrease in soft tissue tension  STM bilat QL and L1-5 paraspinals  STM L UT, bilat cervical paraspinals UPA grade III-IV bilat C3-6, inf medial glide grade IV same levels Manual traction    Treatment                            11/6  Trigger Point Dry-Needling  Treatment instructions: Expect mild to moderate muscle soreness. S/S of pneumothorax if dry needled over a lung field, and to seek immediate medical attention should they occur. Patient verbalized understanding of these instructions and education.   Patient Consent Given: Yes Education (verbally/handout)provided: Yes Muscles treated: bilat L3-4 paraspinals with shelfing technique Electrical stimulation performed: no Parameters:   N/A Treatment response/outcome: twitch response, elicited, decrease in soft tissue tension  STM bilat QL  and L1-5 paraspinals  Review of hip hinging with dowel at wall and to chair Lifting mechanics discussion   Treatment                            11/4:  Trigger Point Dry Needling, Manual Therapy Treatment:  Initial or subsequent education regarding Trigger Point Dry Needling: Subsequent Did patient give consent to treatment with Trigger Point Dry Needling: Yes TPDN with skilled palpation and monitoring followed by STM to the following muscles: Rt glut max/med, Lt thoracic paraspinals followed by mid-thoracic rib mobs bilaterally  Seated piriformis stretch Standing squat deadlift with blue tband Seated hip hinge with bar- progressed to sit<>stand, to table taps Seated hamstring stretch Standing horizontal abduction lift from wall    10/28 STM L UT, bilat cervical paraspinals UPA grade III-IV bilat C3-6, inf medial glide grade IV same levels Mnaula traction Manual traction with chin tuck 2x10  Supine chin tuck with rotation 2x10 RTB shrug with head tilt (loaded eccentric, light) 2x10  SNAG cervical rotation with pillowcase 2x10 bilat                                                                                                                       11/27/22 Manual: STM to cervical paraspinals, Grade II-III R and L UPA C3-C7, manual traction, L lateral glide C3-C7, UT stretch Prone shoulder extension 3# 2 x 15 Prone shoulder horizontal abduction 3# 2 x 10  Open book short lever arm shoulder 1 x 10 with 5 seconds bilateral    Treatment                            11/24/22:  Trigger Point Dry Needling, Manual Therapy Treatment:  Initial or subsequent education regarding Trigger Point Dry Needling: Subsequent Did patient give consent to treatment with Trigger Point Dry Needling: Yes TPDN with skilled palpation and monitoring followed by STM to the following muscles: Lt upper trap, levator, rhomboid  Prone retraction, + GHJ ext Rt sidelying resisted scap  protraction/retraction Sidelying Lt ER  Sidelying Lt scapular mobilizations & distraction Supine cervical distraction, bil first rib mobilization, suboccipital release, C0 on C1 AP mobs 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-1 knee on table, ipsilat foot off table and on floor     PATIENT EDUCATION:  Education details:  Teacher, music of condition, POC, HEP, exercise form/rationale, periodization, precautions and safety Person educated: Patient and Spouse Education method: Explanation, Demonstration, Tactile cues, Verbal cues, and Handouts Education comprehension: verbalized understanding, returned demonstration, verbal cues required, tactile cues required, and needs further education  HOME EXERCISE PROGRAM: [id number]   ASSESSMENT:  CLINICAL IMPRESSION: Pt with measurable improvements in subjective and objective measures with significant progress into the neck/upper quarter with activity. Pt is limited in activity by sciatica related back pain that is chronic in nature. Due to fusion, pt has expected limitations in ROM with tightness and pain. Pt session focused on discussion of exercise options, safety, precautions/indications for certain exercise as we transition to the gym setting with personal training. Pt and PT agree to goal of trying to transition to personal training in the next 4-6 wks. Plan to see pt at a decreased frequency with goal of future sessions to demo and discuss proper form and technique with free weight and machine based exercise in the setting of lumbar fusion. Edu about current lumbar fusion evidence discussed. Pt would benefit from continued skilled therapy in order to reach goals and maximize functional cervical and lumboelvic strength and ROM for prevention of further functional decline.    REHAB POTENTIAL: Good  CLINICAL DECISION MAKING: Stable/uncomplicated  EVALUATION COMPLEXITY: Low   GOALS: Goals reviewed with patient? Yes  SHORT TERM GOALS: Target date:  10/26  Able to demo proper resting posture with awareness of correction of forward head.  Baseline: Goal status: achieved 10/21    LONG TERM GOALS: Target date: POC date  Meet FOTO goal Baseline:  Goal status: MET  2.  AROM cervical rotation to 60 deg bil without discomfort Baseline:  met  3.  Able to participate in activities without limitation by spasm Baseline:  Goal status: ongoing  4.  Bil GHJ ER to 5/5 Baseline:  Goal status: MET  5. Pt will be able to demonstrate DL squat/deadlift with >/=56 lbs in order to demonstrate functional improvement exercise tolerance and exercise technique for transition to independent gym exercise with personal training.  NEW GOAL  6. Pt  will become independent with final HEP in order to demonstrate synthesis of PT education for exercise in gym setting.  NEW GOAL     PLAN:  PT FREQUENCY: 1-2x/week  PT DURATION: 12 wks (d/c in 6-8)   PLANNED INTERVENTIONS: Therapeutic exercises, Therapeutic activity, Neuromuscular re-education, Patient/Family education, Self Care, Joint mobilization, Stair training, Aquatic Therapy, Dry Needling, Electrical stimulation, Spinal mobilization, Cryotherapy, Moist heat, Taping, Ionotophoresis 4mg /ml Dexamethasone, Manual therapy, and Re-evaluation.  PLAN FOR NEXT SESSION: DN PRN, deep neck flexor & periscap strengthening.   Zebedee Iba PT, DPT 01/19/23 10:28 AM

## 2023-01-21 ENCOUNTER — Encounter (HOSPITAL_BASED_OUTPATIENT_CLINIC_OR_DEPARTMENT_OTHER): Payer: Self-pay

## 2023-01-21 ENCOUNTER — Encounter (HOSPITAL_BASED_OUTPATIENT_CLINIC_OR_DEPARTMENT_OTHER): Payer: HMO | Admitting: Physical Therapy

## 2023-01-22 DIAGNOSIS — G4730 Sleep apnea, unspecified: Secondary | ICD-10-CM | POA: Diagnosis not present

## 2023-01-22 DIAGNOSIS — G4733 Obstructive sleep apnea (adult) (pediatric): Secondary | ICD-10-CM | POA: Diagnosis not present

## 2023-01-26 ENCOUNTER — Ambulatory Visit (HOSPITAL_BASED_OUTPATIENT_CLINIC_OR_DEPARTMENT_OTHER): Payer: HMO | Admitting: Physical Therapy

## 2023-01-26 DIAGNOSIS — M542 Cervicalgia: Secondary | ICD-10-CM

## 2023-01-26 DIAGNOSIS — M62830 Muscle spasm of back: Secondary | ICD-10-CM

## 2023-01-26 NOTE — Therapy (Signed)
OUTPATIENT PHYSICAL THERAPY TREATMENT   Patient Name: Eric Cochran MRN: [id number] DOB:1950-04-16, 72 y.o., male Today's Date: 01/26/2023  END OF SESSION:  PT End of Session - 01/26/23 1037     Visit Number 20    Number of Visits 30    Date for PT Re-Evaluation 04/19/23    Authorization Type HTA    PT Start Time 0930    PT Stop Time 1014    PT Time Calculation (min) 44 min                       Past Medical History:  Diagnosis Date   Arthritis    Obesity (BMI 30-39.9)    Obstructive sleep apnea    severe- on VPAP auto    Perirectal abscess    Past Surgical History:  Procedure Laterality Date   ANKLE SURGERY     BACK SURGERY     HERNIA REPAIR     umb hernia   INCISION AND DRAINAGE PERIRECTAL ABSCESS  04/08/2011   Procedure: IRRIGATION AND DEBRIDEMENT PERIRECTAL ABSCESS;  Surgeon: Clovis Pu. Cornett, MD;  Location: [facility] OR;  Service: General;  Laterality: N/A;   LIGAMENT REPAIR     right arm ulna   NASAL SINUS SURGERY     Patient Active Problem List   Diagnosis Date Noted   Obstructive sleep apnea    Obesity (BMI 30-39.9)      REFERRING PROVIDER:  Dawley, Troy C, DO    REFERRING DIAG: M54.2 (ICD-10-CM) - Cervicalgia   Dry Needling  Rationale for Evaluation and Treatment: Rehabilitation  THERAPY DIAG:  Cervicalgia  Muscle spasm of back  ONSET DATE: subacute on chronic   SUBJECTIVE:                                                                                                                                                                                           SUBJECTIVE STATEMENT: Pt states he has started a steroid pack from MD that has improved it by 50%. He still has cramping in the lower back. He has trouble sleeping and finds himself waking up due to getting into uncomfortable positions.    PERTINENT HISTORY:  L5 S1 fusion bony, L3-4 plate fusion  PAIN:  Are you having pain? Yes: NPRS scale: 2/10 Pain location: R lateral  ankle and into the knee  Pain description: spasms, general discomfort Aggravating factors: looking up Relieving factors: ice  PRECAUTIONS:  None  RED FLAGS: None   WEIGHT BEARING RESTRICTIONS:  No  FALLS:  Has patient fallen in last 6 months? No    OCCUPATION:  Agricultural consultant  at [employer]  PLOF:  Independent  PATIENT GOALS:  Decrease pain, not be limited in activities due to pain   OBJECTIVE:  Note: Objective measures were completed at Evaluation unless otherwise noted.  DIAGNOSTIC FINDINGS:  No recent imaging available  PATIENT SURVEYS:  FOTO 50  11/11 FOTO 61  12/16  FOTO 66% (GOAL  MET)  ODI  Modified Oswestry Low Back Pain Disability Questionnaire: 20 / 50 = 40.0 %  POSTURE:  forward head, decreased lumbar lordosis, and decreased thoracic kyphosis  HAND DOMINANCE:  Right   Body Part #1 Cervical   CERVICAL ROM:   Active ROM A/PROM (deg) eval 11/13/22 10/14 11/20/22 11/4 11/11 12/16  Flexion 18 35  41  50 55  Extension 22 35  42  42 50  Right lateral flexion 14 20  24  improves to 25 26 27 30   Left lateral flexion 18 18  19  improves to 25 26 28 30   Right rotation 50 52 62 after manual 64 improved to 73 66 68 69  Left rotation 52 55 62 after manual 70 improved to 73 68 69 68   (Blank rows = not tested)  MMT Right 12/16  Left 12/16  Shoulder flexion 5 5  Shoulder extension    Shoulder abduction 5 5  Shoulder adduction    Shoulder internal rotation 5 5  Shoulder external rotation 5 5  (Blank rows = not tested)  LUMBAR ROM:   Active  A/PROM  12/16  Flexion 50%  Extension 10% p!  Right lateral flexion 25%   Left lateral flexion 25%  Right rotation 25% p!  Left rotation 25% p!    (Blank rows = not tested)      TREATMENT:       12/23  Nustep lvl 5 5 min  Bent over row 13lb KB 2x10 TRX deep squat 10x 3s holds Bird dog 3x20 Paloff 10x with purple band; 10x 12lb cable  Deadlift to box 13lbs KB 5x5  12/16  Edu  of exam results  Walk through of the following gym exercise: demo of technique, set up, and set/rep range; exercise precaution, indications and contra-indications  Bent over row TRX deep squat Bird dog Paloff Deadlift to box   12/9  STM R posterior hip, passive release Lumbar grade II UPA on R STM R QL     Thermotherapy, active recovery management, self pain management, massage therapy options and practitioners   12/4  Nustep warm up 5 min lvl 5  nerve glide 2x10 seated with ankle IV for L4-5 Supine bridge with march 2x20 (up the entire time) Bird dog 2x20 Cable hip hinge/ext 10x 15lbs, 2x10 30lbs  Cable row 3x8 27.5lbs (unilateral cable)  YTB sidestepping 2x hallway lap Shuttle leg press (set up edu) 125 lbs 3x10    12/2   STM R posterior hip, passive release; STM bilat QL   Supine nerve glide 2x10 5lb goblet STS 4x6-8 Supine hip ABD with bridge 2x10 2s YTB sidestepping 2x hallway lap YTB retro step 2x hallway laps   11/27  STM R posterior hip, passive release  Supine nerve glide 2x10 Supine piriformis 4x 10s each Supine hip ABD iso 2x3 10s   Thermotherapy, active recovery management   11/20  STM and R UT and L/S  Seated table T/S ext 10x  Cable row 3x8 35lbs Cable shoulder extension 20lbs 2x10 Cable face pull 20lbs 2x10  11/18  CPA and UPA T3-12 grade III-IV  STM T/S  paraspinals  Seated T/S ext 10x 3s  Open book 10x each side 5s Seated T/S ext with towel roll 5s 2x10    11/13  Trigger Point Dry-Needling  Treatment instructions: Expect mild to moderate muscle soreness. S/S of pneumothorax if dry needled over a lung field, and to seek immediate medical attention should they occur. Patient verbalized understanding of these instructions and education.   Patient Consent Given: Yes Education (verbally/handout)provided: Yes Muscles treated: bilat L3-4 paraspinals with shelfing technique; R C4-7 paraspinals with shelfing Electrical  stimulation performed: no Parameters:   N/A Treatment response/outcome: twitch response, elicited, decrease in soft tissue tension  STM bilat QL and L1-5 paraspinals Prone R UPA grade III-IV R C3-6  Curl up 3s 2x10 Self STM techniques Rocabado exercises for jaw pain (hand out provided) Postural changes to reduce TMD; chin tucking, jaw neutral resting position   Treatment                            11/11  Trigger Point Dry-Needling  Treatment instructions: Expect mild to moderate muscle soreness. S/S of pneumothorax if dry needled over a lung field, and to seek immediate medical attention should they occur. Patient verbalized understanding of these instructions and education.   Patient Consent Given: Yes Education (verbally/handout)provided: Yes Muscles treated: bilat L3-4 paraspinals with shelfing technique Electrical stimulation performed: no Parameters:   N/A Treatment response/outcome: twitch response, elicited, decrease in soft tissue tension  STM bilat QL and L1-5 paraspinals  STM L UT, bilat cervical paraspinals UPA grade III-IV bilat C3-6, inf medial glide grade IV same levels Manual traction    Treatment                            11/6  Trigger Point Dry-Needling  Treatment instructions: Expect mild to moderate muscle soreness. S/S of pneumothorax if dry needled over a lung field, and to seek immediate medical attention should they occur. Patient verbalized understanding of these instructions and education.   Patient Consent Given: Yes Education (verbally/handout)provided: Yes Muscles treated: bilat L3-4 paraspinals with shelfing technique Electrical stimulation performed: no Parameters:   N/A Treatment response/outcome: twitch response, elicited, decrease in soft tissue tension  STM bilat QL and L1-5 paraspinals  Review of hip hinging with dowel at wall and to chair Lifting mechanics discussion   Treatment                            11/4:  Trigger Point Dry  Needling, Manual Therapy Treatment:  Initial or subsequent education regarding Trigger Point Dry Needling: Subsequent Did patient give consent to treatment with Trigger Point Dry Needling: Yes TPDN with skilled palpation and monitoring followed by STM to the following muscles: Rt glut max/med, Lt thoracic paraspinals followed by mid-thoracic rib mobs bilaterally  Seated piriformis stretch Standing squat deadlift with blue tband Seated hip hinge with bar- progressed to sit<>stand, to table taps Seated hamstring stretch Standing horizontal abduction lift from wall    10/28 STM L UT, bilat cervical paraspinals UPA grade III-IV bilat C3-6, inf medial glide grade IV same levels [place] traction Manual traction with chin tuck 2x10  Supine chin tuck with rotation 2x10 RTB shrug with head tilt (loaded eccentric, light) 2x10 SNAG cervical rotation with pillowcase 2x10 bilat  11/27/22 Manual: STM to cervical paraspinals, Grade II-III R and L UPA C3-C7, manual traction, L lateral glide C3-C7, UT stretch Prone shoulder extension 3# 2 x 15 Prone shoulder horizontal abduction 3# 2 x 10  Open book short lever arm shoulder 1 x 10 with 5 seconds bilateral    Treatment                            11/24/22:  Trigger Point Dry Needling, Manual Therapy Treatment:  Initial or subsequent education regarding Trigger Point Dry Needling: Subsequent Did patient give consent to treatment with Trigger Point Dry Needling: Yes TPDN with skilled palpation and monitoring followed by STM to the following muscles: Lt upper trap, levator, rhomboid  Prone retraction, + GHJ ext Rt sidelying resisted scap protraction/retraction Sidelying Lt ER  Sidelying Lt scapular mobilizations & distraction Supine cervical distraction, bil first rib mobilization, suboccipital release, C0 on C1 AP mobs Wall push up  plus Lawn mower yellow tband-1 knee on table, ipsilat foot off table and on floor     PATIENT EDUCATION:  Education details:  Teacher, music of condition, POC, HEP, exercise form/rationale, periodization, precautions and safety Person educated: Patient and Spouse Education method: Explanation, Demonstration, Tactile cues, Verbal cues, and Handouts Education comprehension: verbalized understanding, returned demonstration, verbal cues required, tactile cues required, and needs further education  HOME EXERCISE PROGRAM: [id number]   ASSESSMENT:  CLINICAL IMPRESSION: Pt presents with less irritable and intense irritation of the previous sciatica like symptoms and LBP. Pt was able to work through previously discussed HEP with light loaded and focus on proper technique with loaded exercise. Lumbopelvic strengthening maintained in neutral position throughout session to aid I reduction of irritation at the low back. No recreation of pain today. HEP updated and printout provided of current gym exercise. Plan to continue with free weights at next session with focus on upper extremity. Consider farmer's carry and barbell holds at next.  Pt would benefit from continued skilled therapy in order to reach goals and maximize functional cervical and lumboelvic strength and ROM for prevention of further functional decline.    REHAB POTENTIAL: Good  CLINICAL DECISION MAKING: Stable/uncomplicated  EVALUATION COMPLEXITY: Low   GOALS: Goals reviewed with patient? Yes  SHORT TERM GOALS: Target date: 10/26  Able to demo proper resting posture with awareness of correction of forward head.  Baseline: Goal status: achieved 10/21    LONG TERM GOALS: Target date: POC date  Meet FOTO goal Baseline:  Goal status: MET  2.  AROM cervical rotation to 60 deg bil without discomfort Baseline:  met  3.  Able to participate in activities without limitation by spasm Baseline:  Goal status: ongoing  4.  Bil GHJ  ER to 5/5 Baseline:  Goal status: MET  5. Pt will be able to demonstrate DL squat/deadlift with >/=16 lbs in order to demonstrate functional improvement exercise tolerance and exercise technique for transition to independent gym exercise with personal training.  NEW GOAL  6. Pt  will become independent with final HEP in order to demonstrate synthesis of PT education for exercise in gym setting.  NEW GOAL     PLAN:  PT FREQUENCY: 1-2x/week  PT DURATION: 12 wks (d/c in 6-8)   PLANNED INTERVENTIONS: Therapeutic exercises, Therapeutic activity, Neuromuscular re-education, Patient/Family education, Self Care, Joint mobilization, Stair training, Aquatic Therapy, Dry Needling, Electrical stimulation, Spinal mobilization, Cryotherapy, Moist heat, Taping, Ionotophoresis 4mg /ml Dexamethasone, Manual therapy, and Re-evaluation.  PLAN FOR  NEXT SESSION: DN PRN, deep neck flexor & periscap strengthening.   Zebedee Iba PT, DPT 01/26/23 10:37 AM

## 2023-01-28 ENCOUNTER — Other Ambulatory Visit: Payer: Self-pay | Admitting: Medical Genetics

## 2023-01-29 ENCOUNTER — Encounter (HOSPITAL_BASED_OUTPATIENT_CLINIC_OR_DEPARTMENT_OTHER): Payer: HMO | Admitting: Physical Therapy

## 2023-02-02 ENCOUNTER — Encounter (HOSPITAL_BASED_OUTPATIENT_CLINIC_OR_DEPARTMENT_OTHER): Payer: Self-pay | Admitting: Physical Therapy

## 2023-02-02 ENCOUNTER — Ambulatory Visit (HOSPITAL_BASED_OUTPATIENT_CLINIC_OR_DEPARTMENT_OTHER): Payer: HMO | Admitting: Physical Therapy

## 2023-02-02 DIAGNOSIS — M542 Cervicalgia: Secondary | ICD-10-CM | POA: Diagnosis not present

## 2023-02-02 DIAGNOSIS — M62830 Muscle spasm of back: Secondary | ICD-10-CM

## 2023-02-02 NOTE — Therapy (Signed)
OUTPATIENT PHYSICAL THERAPY TREATMENT   Patient Name: Eric Cochran MRN: [id number] DOB:1950-03-28, 72 y.o., male Today's Date: 02/02/2023  END OF SESSION:              Past Medical History:  Diagnosis Date   Arthritis    Obesity (BMI 30-39.9)    Obstructive sleep apnea    severe- on VPAP auto    Perirectal abscess    Past Surgical History:  Procedure Laterality Date   ANKLE SURGERY     BACK SURGERY     HERNIA REPAIR     umb hernia   INCISION AND DRAINAGE PERIRECTAL ABSCESS  04/08/2011   Procedure: IRRIGATION AND DEBRIDEMENT PERIRECTAL ABSCESS;  Surgeon: Clovis Pu. Cornett, MD;  Location: [facility] OR;  Service: General;  Laterality: N/A;   LIGAMENT REPAIR     right arm ulna   NASAL SINUS SURGERY     Patient Active Problem List   Diagnosis Date Noted   Obstructive sleep apnea    Obesity (BMI 30-39.9)      REFERRING PROVIDER:  Dawley, Troy C, DO    REFERRING DIAG: M54.2 (ICD-10-CM) - Cervicalgia   Dry Needling  Rationale for Evaluation and Treatment: Rehabilitation  THERAPY DIAG:  No diagnosis found.  ONSET DATE: subacute on chronic   SUBJECTIVE:                                                                                                                                                                                           SUBJECTIVE STATEMENT: Pt states that the R sided neck pain is started up again. It is up to a 4/10 and uable to move his neck as far. Due to his sciatica pain, he has had to modify his sleeping position which might be causing his pain. The neck rotaiton to the right is less and less.   PERTINENT HISTORY:  L5 S1 fusion bony, L3-4 plate fusion  PAIN:  Are you having pain? Yes: NPRS scale: 4/10 Pain location: R sided neck  Pain description: spasms, general discomfort Aggravating factors: looking up Relieving factors: ice  PRECAUTIONS:  None  RED FLAGS: None   WEIGHT BEARING RESTRICTIONS:  No  FALLS:  Has patient  fallen in last 6 months? 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  DIAGNOSTIC FINDINGS:  No recent imaging available  PATIENT SURVEYS:  FOTO 50  11/11 FOTO 61  12/16  FOTO 66% (  GOAL  MET)  ODI  Modified Oswestry Low Back Pain Disability Questionnaire: 20 / 50 = 40.0 %  POSTURE:  forward head, decreased lumbar lordosis, and decreased thoracic kyphosis  HAND DOMINANCE:  Right   Body Part #1 Cervical   CERVICAL ROM:   Active ROM A/PROM (deg) eval 11/13/22 10/14 11/20/22 11/4 11/11 12/16  Flexion 18 35  41  50 55  Extension 22 35  42  42 50  Right lateral flexion 14 20  24  improves to 25 26 27 30   Left lateral flexion 18 18  19  improves to 25 26 28 30   Right rotation 50 52 62 after manual 64 improved to 73 66 68 69  Left rotation 52 55 62 after manual 70 improved to 73 68 69 68   (Blank rows = not tested)  MMT Right 12/16  Left 12/16  Shoulder flexion 5 5  Shoulder extension    Shoulder abduction 5 5  Shoulder adduction    Shoulder internal rotation 5 5  Shoulder external rotation 5 5  (Blank rows = not tested)  LUMBAR ROM:   Active  A/PROM  12/16  Flexion 50%  Extension 10% p!  Right lateral flexion 25%   Left lateral flexion 25%  Right rotation 25% p!  Left rotation 25% p!    (Blank rows = not tested)      TREATMENT:   12/30  Periscapular STM in shoulder IR position (underside of scap) STM L lat STM R UT and levator     R UPA and CPA of C3-6 grade III R SB and glide mob grade III  Self release with tennis ball Lat stretch and uppper back stretch  12/23  Nustep lvl 5 5 min  Bent over row 13lb KB 2x10 TRX deep squat 10x 3s holds Bird dog 3x20 Paloff 10x with purple band; 10x 12lb cable  Deadlift to box 13lbs KB  5x5  12/16  Edu of exam results  Walk through of the following gym exercise: demo of technique, set up, and set/rep range; exercise precaution, indications and contra-indications  Bent over row TRX deep squat Bird dog Paloff Deadlift to box   12/9  STM R posterior hip, passive release Lumbar grade II UPA on R STM R QL     Thermotherapy, active recovery management, self pain management, massage therapy options and practitioners   12/4  Nustep warm up 5 min lvl 5  nerve glide 2x10 seated with ankle IV for L4-5 Supine bridge with march 2x20 (up the entire time) Bird dog 2x20 Cable hip hinge/ext 10x 15lbs, 2x10 30lbs  Cable row 3x8 27.5lbs (unilateral cable)  YTB sidestepping 2x hallway lap Shuttle leg press (set up edu) 125 lbs 3x10    12/2   STM R posterior hip, passive release; STM bilat QL   Supine nerve glide 2x10 5lb goblet STS 4x6-8 Supine hip ABD with bridge 2x10 2s YTB sidestepping 2x hallway lap YTB retro step 2x hallway laps   11/27  STM R posterior hip, passive release  Supine nerve glide 2x10 Supine piriformis 4x 10s each Supine hip ABD iso 2x3 10s   Thermotherapy, active recovery management   11/20  STM and R UT and L/S  Seated table T/S ext 10x  Cable row 3x8 35lbs Cable shoulder extension 20lbs 2x10 Cable face pull 20lbs 2x10  11/18  CPA and UPA T3-12 grade III-IV  STM T/S paraspinals  Seated T/S ext 10x 3s  Open book 10x each side  5s Seated T/S ext with towel roll 5s 2x10    11/13  Trigger Point Dry-Needling  Treatment instructions: Expect mild to moderate muscle soreness. S/S of pneumothorax if dry needled over a lung field, and to seek immediate medical attention should they occur. Patient verbalized understanding of these instructions and education.   Patient Consent Given: Yes Education (verbally/handout)provided: Yes Muscles treated: bilat L3-4 paraspinals with shelfing technique; R C4-7 paraspinals with  shelfing Electrical stimulation performed: no Parameters:   N/A Treatment response/outcome: twitch response, elicited, decrease in soft tissue tension  STM bilat QL and L1-5 paraspinals Prone R UPA grade III-IV R C3-6  Curl up 3s 2x10 Self STM techniques Rocabado exercises for jaw pain (hand out provided) Postural changes to reduce TMD; chin tucking, jaw neutral resting position   Treatment                            11/11  Trigger Point Dry-Needling  Treatment instructions: Expect mild to moderate muscle soreness. S/S of pneumothorax if dry needled over a lung field, and to seek immediate medical attention should they occur. Patient verbalized understanding of these instructions and education.   Patient Consent Given: Yes Education (verbally/handout)provided: Yes Muscles treated: bilat L3-4 paraspinals with shelfing technique Electrical stimulation performed: no Parameters:   N/A Treatment response/outcome: twitch response, elicited, decrease in soft tissue tension  STM bilat QL and L1-5 paraspinals  STM L UT, bilat cervical paraspinals UPA grade III-IV bilat C3-6, inf medial glide grade IV same levels Manual traction    Treatment                            11/6  Trigger Point Dry-Needling  Treatment instructions: Expect mild to moderate muscle soreness. S/S of pneumothorax if dry needled over a lung field, and to seek immediate medical attention should they occur. Patient verbalized understanding of these instructions and education.   Patient Consent Given: Yes Education (verbally/handout)provided: Yes Muscles treated: bilat L3-4 paraspinals with shelfing technique Electrical stimulation performed: no Parameters:   N/A Treatment response/outcome: twitch response, elicited, decrease in soft tissue tension  STM bilat QL and L1-5 paraspinals  Review of hip hinging with dowel at wall and to chair Lifting mechanics discussion   Treatment                             11/4:  Trigger Point Dry Needling, Manual Therapy Treatment:  Initial or subsequent education regarding Trigger Point Dry Needling: Subsequent Did patient give consent to treatment with Trigger Point Dry Needling: Yes TPDN with skilled palpation and monitoring followed by STM to the following muscles: Rt glut max/med, Lt thoracic paraspinals followed by mid-thoracic rib mobs bilaterally  Seated piriformis stretch Standing squat deadlift with blue tband Seated hip hinge with bar- progressed to sit<>stand, to table taps Seated hamstring stretch Standing horizontal abduction lift from wall    10/28 STM L UT, bilat cervical paraspinals UPA grade III-IV bilat C3-6, inf medial glide grade IV same levels [place] traction Manual traction with chin tuck 2x10  Supine chin tuck with rotation 2x10 RTB shrug with head tilt (loaded eccentric, light) 2x10 SNAG cervical rotation with pillowcase 2x10 bilat  11/27/22 Manual: STM to cervical paraspinals, Grade II-III R and L UPA C3-C7, manual traction, L lateral glide C3-C7, UT stretch Prone shoulder extension 3# 2 x 15 Prone shoulder horizontal abduction 3# 2 x 10  Open book short lever arm shoulder 1 x 10 with 5 seconds bilateral    Treatment                            11/24/22:  Trigger Point Dry Needling, Manual Therapy Treatment:  Initial or subsequent education regarding Trigger Point Dry Needling: Subsequent Did patient give consent to treatment with Trigger Point Dry Needling: Yes TPDN with skilled palpation and monitoring followed by STM to the following muscles: Lt upper trap, levator, rhomboid  Prone retraction, + GHJ ext Rt sidelying resisted scap protraction/retraction Sidelying Lt ER  Sidelying Lt scapular mobilizations & distraction Supine cervical distraction, bil first rib mobilization, suboccipital release, C0  on C1 AP mobs 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-1 knee on table, ipsilat foot off table and on floor     PATIENT EDUCATION:  Education details:  Teacher, music of condition, POC, HEP, exercise form/rationale, periodization, precautions and safety Person educated: Patient and Spouse Education method: Explanation, Demonstration, Tactile cues, Verbal cues, and Handouts Education comprehension: verbalized understanding, returned demonstration, verbal cues required, tactile cues required, and needs further education  HOME EXERCISE PROGRAM: [id number]   ASSESSMENT:  CLINICAL IMPRESSION: Pt presents with increase in R upper quarter pain that appears to stem from "snapping" scapular pain that was palpated at beginning of session. Upward rotation of scap limited which likely caused R sided neck pain. Pt able to improve to 75 deg of C/S rotation and ability to raise R UE OH without snapping pain. Plan to continue with free weights at next session with focus on upper extremity. Consider farmer's carry and barbell holds at next.  Pt would benefit from continued skilled therapy in order to reach goals and maximize functional cervical and lumboelvic strength and ROM for prevention of further functional decline.    REHAB POTENTIAL: Good  CLINICAL DECISION MAKING: Stable/uncomplicated  EVALUATION COMPLEXITY: Low   GOALS: Goals reviewed with patient? Yes  SHORT TERM GOALS: Target date: 10/26  Able to demo proper resting posture with awareness of correction of forward head.  Baseline: Goal status: achieved 10/21    LONG TERM GOALS: Target date: POC date  Meet FOTO goal Baseline:  Goal status: MET  2.  AROM cervical rotation to 60 deg bil without discomfort Baseline:  met  3.  Able to participate in activities without limitation by spasm Baseline:  Goal status: ongoing  4.  Bil GHJ ER to 5/5 Baseline:  Goal status: MET  5. Pt will be able to demonstrate DL squat/deadlift with  >/=32 lbs in order to demonstrate functional improvement exercise tolerance and exercise technique for transition to independent gym exercise with personal training.  NEW GOAL  6. Pt  will become independent with final HEP in order to demonstrate synthesis of PT education for exercise in gym setting.  NEW GOAL     PLAN:  PT FREQUENCY: 1-2x/week  PT DURATION: 12 wks (d/c in 6-8)   PLANNED INTERVENTIONS: Therapeutic exercises, Therapeutic activity, Neuromuscular re-education, Patient/Family education, Self Care, Joint mobilization, Stair training, Aquatic Therapy, Dry Needling, Electrical stimulation, Spinal mobilization, Cryotherapy, Moist heat, Taping, Ionotophoresis 4mg /ml Dexamethasone, Manual therapy, and Re-evaluation.  PLAN FOR NEXT SESSION: DN PRN, deep neck flexor & periscap strengthening.  Zebedee Iba PT, DPT 02/02/23 8:06 AM

## 2023-02-06 ENCOUNTER — Encounter (HOSPITAL_BASED_OUTPATIENT_CLINIC_OR_DEPARTMENT_OTHER): Payer: HMO | Admitting: Physical Therapy

## 2023-02-09 ENCOUNTER — Ambulatory Visit (HOSPITAL_BASED_OUTPATIENT_CLINIC_OR_DEPARTMENT_OTHER): Payer: HMO | Attending: Neurological Surgery | Admitting: Physical Therapy

## 2023-02-09 ENCOUNTER — Encounter (HOSPITAL_BASED_OUTPATIENT_CLINIC_OR_DEPARTMENT_OTHER): Payer: Self-pay | Admitting: Physical Therapy

## 2023-02-09 DIAGNOSIS — M62830 Muscle spasm of back: Secondary | ICD-10-CM | POA: Diagnosis present

## 2023-02-09 DIAGNOSIS — M542 Cervicalgia: Secondary | ICD-10-CM | POA: Insufficient documentation

## 2023-02-09 NOTE — Therapy (Signed)
 OUTPATIENT PHYSICAL THERAPY TREATMENT   Patient Name: Eric Cochran MRN: [id number] DOB:1950-07-14, 73 y.o., male Today's Date: 02/09/2023  END OF SESSION:  PT End of Session - 02/09/23 0833     Visit Number 22    Number of Visits 30    Date for PT Re-Evaluation 04/19/23    Authorization Type HTA    PT Start Time 0800    PT Stop Time 0841    PT Time Calculation (min) 41 min    Activity Tolerance Patient tolerated treatment well    Behavior During Therapy [facility] for tasks assessed/performed                        Past Medical History:  Diagnosis Date   Arthritis    Obesity (BMI 30-39.9)    Obstructive sleep apnea    severe- on VPAP auto    Perirectal abscess    Past Surgical History:  Procedure Laterality Date   ANKLE SURGERY     BACK SURGERY     HERNIA REPAIR     umb hernia   INCISION AND DRAINAGE PERIRECTAL ABSCESS  04/08/2011   Procedure: IRRIGATION AND DEBRIDEMENT PERIRECTAL ABSCESS;  Surgeon: Debby LABOR. Cornett, MD;  Location: [facility] OR;  Service: General;  Laterality: N/A;   LIGAMENT REPAIR     right arm ulna   NASAL SINUS SURGERY     Patient Active Problem List   Diagnosis Date Noted   Obstructive sleep apnea    Obesity (BMI 30-39.9)      REFERRING PROVIDER:  Dawley, Troy C, DO    REFERRING DIAG: M54.2 (ICD-10-CM) - Cervicalgia   Dry Needling  Rationale for Evaluation and Treatment: Rehabilitation  THERAPY DIAG:  Cervicalgia  Muscle spasm of back  ONSET DATE: subacute on chronic   SUBJECTIVE:                                                                                                                                                                                           SUBJECTIVE STATEMENT: Pt states that the shoulder pain is still there from last session. Pt requests the manual therapy techniques for the areas worked on last time.   PERTINENT HISTORY:  L5 S1 fusion bony, L3-4 plate fusion  PAIN:  Are you having pain?  Yes: NPRS scale: 3-4/10 Pain location: R sided scapular area  Pain description: spasms, general discomfort Aggravating factors: looking up Relieving factors: ice  PRECAUTIONS:  None  RED FLAGS: None   WEIGHT BEARING RESTRICTIONS:  No  FALLS:  Has patient fallen in last 6 months? 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  DIAGNOSTIC FINDINGS:  No recent imaging available  PATIENT SURVEYS:  FOTO 50  11/11 FOTO 61  12/16  FOTO 66% (GOAL  MET)  ODI  Modified Oswestry Low Back Pain Disability Questionnaire: 20 / 50 = 40.0 %  POSTURE:  forward head, decreased lumbar lordosis, and decreased thoracic kyphosis  HAND DOMINANCE:  Right   Body Part #1 Cervical   CERVICAL ROM:   Active ROM A/PROM (deg) eval 11/13/22 10/14 11/20/22 11/4 11/11 12/16  Flexion 18 35  41  50 55  Extension 22 35  42  42 50  Right lateral flexion 14 20  24  improves to 25 26 27 30   Left lateral flexion 18 18  19  improves to 25 26 28 30   Right rotation 50 52 62 after manual 64 improved to 73 66 68 69  Left rotation 52 55 62 after manual 70 improved to 73 68 69 68   (Blank rows = not tested)  MMT Right 12/16  Left 12/16  Shoulder flexion 5 5  Shoulder extension    Shoulder abduction 5 5  Shoulder adduction    Shoulder internal rotation 5 5  Shoulder external rotation 5 5  (Blank rows = not tested)  LUMBAR ROM:   Active  A/PROM  12/16  Flexion 50%  Extension 10% p!  Right lateral flexion 25%   Left lateral flexion 25%  Right rotation 25% p!  Left rotation 25% p!    (Blank rows = not tested)      TREATMENT:   1/6  Periscapular STM in shoulder IR position (underside of scap) T/S paraspinals Child's pose with lat stretch 5s 10x Lat stretch with doorway 30s  2x YTB band paloff 2x10  Self STM with ball  Lumbar ext at wall   12/30  Periscapular STM in shoulder IR position (underside of scap) STM L lat STM R UT and levator     R UPA and CPA of C3-6 grade III R SB and glide mob grade III  Self release with tennis ball Lat stretch and uppper back stretch  12/23  Nustep lvl 5 5 min  Bent over row 13lb KB 2x10 TRX deep squat 10x 3s holds Bird dog 3x20 Paloff 10x with purple band; 10x 12lb cable  Deadlift to box 13lbs KB 5x5  12/16  Edu of exam results  Walk through of the following gym exercise: demo of technique, set up, and set/rep range; exercise precaution, indications and contra-indications  Bent over row TRX deep squat Bird dog Paloff Deadlift to box   12/9  STM R posterior hip, passive release Lumbar grade II UPA on R STM R QL     Thermotherapy, active recovery management, self pain management, massage therapy options and practitioners   12/4  Nustep warm up 5 min lvl 5  nerve glide 2x10 seated with ankle IV for L4-5 Supine bridge with march 2x20 (up the entire time) Bird dog 2x20 Cable hip hinge/ext 10x 15lbs, 2x10 30lbs  Cable row 3x8 27.5lbs (unilateral cable)  YTB sidestepping 2x hallway lap Shuttle leg press (set up edu) 125 lbs 3x10    12/2   STM R posterior hip, passive release; STM bilat QL   Supine nerve glide 2x10 5lb goblet STS 4x6-8 Supine hip ABD  with bridge 2x10 2s YTB sidestepping 2x hallway lap YTB retro step 2x hallway laps   11/27  STM R posterior hip, passive release  Supine nerve glide 2x10 Supine piriformis 4x 10s each Supine hip ABD iso 2x3 10s   Thermotherapy, active recovery management   11/20  STM and R UT and L/S  Seated table T/S ext 10x  Cable row 3x8 35lbs Cable shoulder extension 20lbs 2x10 Cable face pull 20lbs 2x10  11/18  CPA and UPA T3-12 grade III-IV  STM T/S paraspinals  Seated T/S ext 10x 3s  Open book 10x each side 5s Seated T/S  ext with towel roll 5s 2x10    11/13  Trigger Point Dry-Needling  Treatment instructions: Expect mild to moderate muscle soreness. S/S of pneumothorax if dry needled over a lung field, and to seek immediate medical attention should they occur. Patient verbalized understanding of these instructions and education.   Patient Consent Given: Yes Education (verbally/handout)provided: Yes Muscles treated: bilat L3-4 paraspinals with shelfing technique; R C4-7 paraspinals with shelfing Electrical stimulation performed: no Parameters:   N/A Treatment response/outcome: twitch response, elicited, decrease in soft tissue tension  STM bilat QL and L1-5 paraspinals Prone R UPA grade III-IV R C3-6  Curl up 3s 2x10 Self STM techniques Rocabado exercises for jaw pain (hand out provided) Postural changes to reduce TMD; chin tucking, jaw neutral resting position   Treatment                            11/11  Trigger Point Dry-Needling  Treatment instructions: Expect mild to moderate muscle soreness. S/S of pneumothorax if dry needled over a lung field, and to seek immediate medical attention should they occur. Patient verbalized understanding of these instructions and education.   Patient Consent Given: Yes Education (verbally/handout)provided: Yes Muscles treated: bilat L3-4 paraspinals with shelfing technique Electrical stimulation performed: no Parameters:   N/A Treatment response/outcome: twitch response, elicited, decrease in soft tissue tension  STM bilat QL and L1-5 paraspinals  STM L UT, bilat cervical paraspinals UPA grade III-IV bilat C3-6, inf medial glide grade IV same levels Manual traction    Treatment                            11/6  Trigger Point Dry-Needling  Treatment instructions: Expect mild to moderate muscle soreness. S/S of pneumothorax if dry needled over a lung field, and to seek immediate medical attention should they occur. Patient verbalized understanding of  these instructions and education.   Patient Consent Given: Yes Education (verbally/handout)provided: Yes Muscles treated: bilat L3-4 paraspinals with shelfing technique Electrical stimulation performed: no Parameters:   N/A Treatment response/outcome: twitch response, elicited, decrease in soft tissue tension  STM bilat QL and L1-5 paraspinals  Review of hip hinging with dowel at wall and to chair Lifting mechanics discussion   Treatment                            11/4:  Trigger Point Dry Needling, Manual Therapy Treatment:  Initial or subsequent education regarding Trigger Point Dry Needling: Subsequent Did patient give consent to treatment with Trigger Point Dry Needling: Yes TPDN with skilled palpation and monitoring followed by STM to the following muscles: Rt glut max/med, Lt thoracic paraspinals followed by mid-thoracic rib mobs bilaterally  Seated piriformis stretch Standing squat deadlift with blue tband Seated  hip hinge with bar- progressed to sit<>stand, to table taps Seated hamstring stretch Standing horizontal abduction lift from wall    10/28 STM L UT, bilat cervical paraspinals UPA grade III-IV bilat C3-6, inf medial glide grade IV same levels [place] traction Manual traction with chin tuck 2x10  Supine chin tuck with rotation 2x10 RTB shrug with head tilt (loaded eccentric, light) 2x10 SNAG cervical rotation with pillowcase 2x10 bilat                                                                                                                       11/27/22 Manual: STM to cervical paraspinals, Grade II-III R and L UPA C3-C7, manual traction, L lateral glide C3-C7, UT stretch Prone shoulder extension 3# 2 x 15 Prone shoulder horizontal abduction 3# 2 x 10  Open book short lever arm shoulder 1 x 10 with 5 seconds bilateral    Treatment                            11/24/22:  Trigger Point Dry Needling, Manual Therapy Treatment:  Initial or subsequent  education regarding Trigger Point Dry Needling: Subsequent Did patient give consent to treatment with Trigger Point Dry Needling: Yes TPDN with skilled palpation and monitoring followed by STM to the following muscles: Lt upper trap, levator, rhomboid  Prone retraction, + GHJ ext Rt sidelying resisted scap protraction/retraction Sidelying Lt ER  Sidelying Lt scapular mobilizations & distraction Supine cervical distraction, bil first rib mobilization, suboccipital release, C0 on C1 AP mobs 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-1 knee on table, ipsilat foot off table and on floor     PATIENT EDUCATION:  Education details:  Teacher, Music of condition, POC, HEP, exercise form/rationale, periodization, precautions and safety Person educated: Patient and Spouse Education method: Explanation, Demonstration, Tactile cues, Verbal cues, and Handouts Education comprehension: verbalized understanding, returned demonstration, verbal cues required, tactile cues required, and needs further education  HOME EXERCISE PROGRAM: [id number]   ASSESSMENT:  CLINICAL IMPRESSION: Pt with recurrent stiffness of the thoracic region from recent increase in activity. Session focused on mobility and self STM techniques to relieve stiffness/discomfort following exercise. Pt generally stiff with soft tissue restriction with ext and flexion. Pt HEP update to include more flexibility based movements on recovery days. Plan to do more isometric abdominal exercise at next. Consider offset carry, modified plank holds, cable walks out, etc at next. Pt would benefit from continued skilled therapy in order to reach goals and maximize functional cervical and lumboelvic strength and ROM for prevention of further functional decline.    REHAB POTENTIAL: Good  CLINICAL DECISION MAKING: Stable/uncomplicated  EVALUATION COMPLEXITY: Low   GOALS: Goals reviewed with patient? Yes  SHORT TERM GOALS: Target date: 10/26  Able to  demo proper resting posture with awareness of correction of forward head.  Baseline: Goal status: achieved 10/21    LONG TERM GOALS: Target date: POC date  Meet FOTO goal  Baseline:  Goal status: MET  2.  AROM cervical rotation to 60 deg bil without discomfort Baseline:  met  3.  Able to participate in activities without limitation by spasm Baseline:  Goal status: ongoing  4.  Bil GHJ ER to 5/5 Baseline:  Goal status: MET  5. Pt will be able to demonstrate DL squat/deadlift with >/=84 lbs in order to demonstrate functional improvement exercise tolerance and exercise technique for transition to independent gym exercise with personal training.  NEW GOAL  6. Pt  will become independent with final HEP in order to demonstrate synthesis of PT education for exercise in gym setting.  NEW GOAL     PLAN:  PT FREQUENCY: 1-2x/week  PT DURATION: 12 wks (d/c in 6-8)   PLANNED INTERVENTIONS: Therapeutic exercises, Therapeutic activity, Neuromuscular re-education, Patient/Family education, Self Care, Joint mobilization, Stair training, Aquatic Therapy, Dry Needling, Electrical stimulation, Spinal mobilization, Cryotherapy, Moist heat, Taping, Ionotophoresis 4mg /ml Dexamethasone , Manual therapy, and Re-evaluation.  PLAN FOR NEXT SESSION: DN PRN, deep neck flexor & periscap strengthening.   Dale Call PT, DPT 02/09/23 9:02 AM

## 2023-02-11 ENCOUNTER — Encounter (HOSPITAL_BASED_OUTPATIENT_CLINIC_OR_DEPARTMENT_OTHER): Payer: HMO | Admitting: Physical Therapy

## 2023-02-16 ENCOUNTER — Encounter (HOSPITAL_BASED_OUTPATIENT_CLINIC_OR_DEPARTMENT_OTHER): Payer: Self-pay | Admitting: Physical Therapy

## 2023-02-16 ENCOUNTER — Ambulatory Visit (HOSPITAL_BASED_OUTPATIENT_CLINIC_OR_DEPARTMENT_OTHER): Payer: HMO | Admitting: Physical Therapy

## 2023-02-16 DIAGNOSIS — M542 Cervicalgia: Secondary | ICD-10-CM | POA: Diagnosis not present

## 2023-02-16 DIAGNOSIS — M62830 Muscle spasm of back: Secondary | ICD-10-CM

## 2023-02-16 NOTE — Therapy (Signed)
 OUTPATIENT PHYSICAL THERAPY TREATMENT   Patient Name: Eric Cochran MRN: [id number] DOB:1950-09-06, 73 y.o., male Today's Date: 02/16/2023  END OF SESSION:  PT End of Session - 02/16/23 0853     Visit Number 23    Number of Visits 30    Date for PT Re-Evaluation 04/19/23    Authorization Type HTA    PT Start Time 0800    PT Stop Time 0842    PT Time Calculation (min) 42 min    Activity Tolerance Patient tolerated treatment well    Behavior During Therapy [facility] for tasks assessed/performed                         Past Medical History:  Diagnosis Date   Arthritis    Obesity (BMI 30-39.9)    Obstructive sleep apnea    severe- on VPAP auto    Perirectal abscess    Past Surgical History:  Procedure Laterality Date   ANKLE SURGERY     BACK SURGERY     HERNIA REPAIR     umb hernia   INCISION AND DRAINAGE PERIRECTAL ABSCESS  04/08/2011   Procedure: IRRIGATION AND DEBRIDEMENT PERIRECTAL ABSCESS;  Surgeon: Debby LABOR. Cornett, MD;  Location: [facility] OR;  Service: General;  Laterality: N/A;   LIGAMENT REPAIR     right arm ulna   NASAL SINUS SURGERY     Patient Active Problem List   Diagnosis Date Noted   Obstructive sleep apnea    Obesity (BMI 30-39.9)      REFERRING PROVIDER:  Dawley, Lani BROCKS, DO    REFERRING DIAG: M54.2 (ICD-10-CM) - Cervicalgia   Dry Needling  Rationale for Evaluation and Treatment: Rehabilitation  THERAPY DIAG:  Cervicalgia  Muscle spasm of back  ONSET DATE: subacute on chronic   SUBJECTIVE:                                                                                                                                                                                           SUBJECTIVE STATEMENT: Pt states he is unable to turn his head today. Shoveling that snow increased the R sided neck pain. He is unable to rotate and looking down is very painful .  PERTINENT HISTORY:  L5 S1 fusion bony, L3-4 plate fusion  PAIN:  Are you  having pain? Yes: NPRS scale: 4/10 Pain location: R neck and shoulder  Pain description: spasms, general discomfort Aggravating factors: looking up Relieving factors: ice  PRECAUTIONS:  None  RED FLAGS: None   WEIGHT BEARING RESTRICTIONS:  No  FALLS:  Has patient fallen in last 6  months? 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  DIAGNOSTIC FINDINGS:  No recent imaging available  PATIENT SURVEYS:  FOTO 50  11/11 FOTO 61  12/16  FOTO 66% (GOAL  MET)  ODI  Modified Oswestry Low Back Pain Disability Questionnaire: 20 / 50 = 40.0 %  POSTURE:  forward head, decreased lumbar lordosis, and decreased thoracic kyphosis  HAND DOMINANCE:  Right   Body Part #1 Cervical   CERVICAL ROM:   Active ROM A/PROM (deg) eval 11/13/22 10/14 11/20/22 11/4 11/11 12/16  Flexion 18 35  41  50 55  Extension 22 35  42  42 50  Right lateral flexion 14 20  24  improves to 25 26 27 30   Left lateral flexion 18 18  19  improves to 25 26 28 30   Right rotation 50 52 62 after manual 64 improved to 73 66 68 69  Left rotation 52 55 62 after manual 70 improved to 73 68 69 68   (Blank rows = not tested)  MMT Right 12/16  Left 12/16  Shoulder flexion 5 5  Shoulder extension    Shoulder abduction 5 5  Shoulder adduction    Shoulder internal rotation 5 5  Shoulder external rotation 5 5  (Blank rows = not tested)  LUMBAR ROM:   Active  A/PROM  12/16  Flexion 50%  Extension 10% p!  Right lateral flexion 25%   Left lateral flexion 25%  Right rotation 25% p!  Left rotation 25% p!    (Blank rows = not tested)      TREATMENT:   1/13  CPA and UPA C3-6 grade III-IV Grade III L and R SB mob STM L and R UT; R levator, R rhomboid  Self mobilization for C/S Self STM Massage  therapy   1/6  Periscapular STM in shoulder IR position (underside of scap) T/S paraspinals Child's pose with lat stretch 5s 10x Lat stretch with doorway 30s 2x YTB band paloff 2x10  Self STM with ball  Lumbar ext at wall   12/30  Periscapular STM in shoulder IR position (underside of scap) STM L lat STM R UT and levator     R UPA and CPA of C3-6 grade III R SB and glide mob grade III  Self release with tennis ball Lat stretch and uppper back stretch  12/23  Nustep lvl 5 5 min  Bent over row 13lb KB 2x10 TRX deep squat 10x 3s holds Bird dog 3x20 Paloff 10x with purple band; 10x 12lb cable  Deadlift to box 13lbs KB 5x5  12/16  Edu of exam results  Walk through of the following gym exercise: demo of technique, set up, and set/rep range; exercise precaution, indications and contra-indications  Bent over row TRX deep squat Bird dog Paloff Deadlift to box   12/9  STM R posterior hip, passive release Lumbar grade II UPA on R STM R QL     Thermotherapy, active recovery management, self pain management, massage therapy options and practitioners   12/4  Nustep warm up 5 min lvl 5  nerve glide 2x10 seated with ankle IV for L4-5 Supine bridge with march 2x20 (up the entire time) Bird dog 2x20 Cable hip hinge/ext 10x 15lbs, 2x10 30lbs  Cable row 3x8 27.5lbs (unilateral cable)  YTB sidestepping 2x hallway lap Shuttle leg press (set up edu) 125 lbs 3x10    12/2   STM R posterior hip, passive release; STM bilat QL   Supine nerve glide 2x10 5lb goblet STS 4x6-8 Supine hip ABD with bridge 2x10 2s YTB sidestepping 2x hallway lap YTB retro step 2x hallway laps   11/27  STM R posterior hip, passive release  Supine nerve glide 2x10 Supine piriformis 4x 10s each Supine hip ABD iso 2x3 10s   Thermotherapy, active recovery management   11/20  STM and R UT and L/S  Seated table T/S ext 10x  Cable row 3x8 35lbs Cable shoulder extension 20lbs  2x10 Cable face pull 20lbs 2x10  11/18  CPA and UPA T3-12 grade III-IV  STM T/S paraspinals  Seated T/S ext 10x 3s  Open book 10x each side 5s Seated T/S ext with towel roll 5s 2x10    11/13  Trigger Point Dry-Needling  Treatment instructions: Expect mild to moderate muscle soreness. S/S of pneumothorax if dry needled over a lung field, and to seek immediate medical attention should they occur. Patient verbalized understanding of these instructions and education.   Patient Consent Given: Yes Education (verbally/handout)provided: Yes Muscles treated: bilat L3-4 paraspinals with shelfing technique; R C4-7 paraspinals with shelfing Electrical stimulation performed: no Parameters:   N/A Treatment response/outcome: twitch response, elicited, decrease in soft tissue tension  STM bilat QL and L1-5 paraspinals Prone R UPA grade III-IV R C3-6  Curl up 3s 2x10 Self STM techniques [place]abado exercises for jaw pain (hand out provided) Postural changes to reduce TMD; chin tucking, jaw neutral resting position   Treatment                            11/11  Trigger Point Dry-Needling  Treatment instructions: Expect mild to moderate muscle soreness. S/S of pneumothorax if dry needled over a lung field, and to seek immediate medical attention should they occur. Patient verbalized understanding of these instructions and education.   Patient Consent Given: Yes Education (verbally/handout)provided: Yes Muscles treated: bilat L3-4 paraspinals with shelfing technique Electrical stimulation performed: no Parameters:   N/A Treatment response/outcome: twitch response, elicited, decrease in soft tissue tension  STM bilat QL and L1-5 paraspinals  STM L UT, bilat cervical paraspinals UPA grade III-IV bilat C3-6, inf medial glide grade IV same levels Manual traction    Treatment                            11/6  Trigger Point Dry-Needling  Treatment instructions: Expect mild to moderate  muscle soreness. S/S of pneumothorax if dry needled over a lung field, and to seek immediate medical attention should they occur. Patient verbalized understanding of these instructions and education.   Patient Consent Given: Yes Education (verbally/handout)provided: Yes Muscles treated: bilat L3-4 paraspinals with shelfing technique Electrical stimulation performed: no Parameters:   N/A Treatment response/outcome: twitch response, elicited, decrease in soft tissue tension  STM bilat QL and L1-5 paraspinals  Review of hip hinging with dowel at wall and to chair Lifting mechanics discussion   Treatment                            11/4:  Trigger Point Dry Needling, Manual Therapy Treatment:  Initial or subsequent education regarding Trigger Point Dry Needling: Subsequent Did patient give consent to treatment  with Trigger Point Dry Needling: Yes TPDN with skilled palpation and monitoring followed by STM to the following muscles: Rt glut max/med, Lt thoracic paraspinals followed by mid-thoracic rib mobs bilaterally  Seated piriformis stretch Standing squat deadlift with blue tband Seated hip hinge with bar- progressed to sit<>stand, to table taps Seated hamstring stretch Standing horizontal abduction lift from wall    10/28 STM L UT, bilat cervical paraspinals UPA grade III-IV bilat C3-6, inf medial glide grade IV same levels [place] traction Manual traction with chin tuck 2x10  Supine chin tuck with rotation 2x10 RTB shrug with head tilt (loaded eccentric, light) 2x10 SNAG cervical rotation with pillowcase 2x10 bilat                                                                                                                       11/27/22 Manual: STM to cervical paraspinals, Grade II-III R and L UPA C3-C7, manual traction, L lateral glide C3-C7, UT stretch Prone shoulder extension 3# 2 x 15 Prone shoulder horizontal abduction 3# 2 x 10  Open book short lever arm shoulder 1 x  10 with 5 seconds bilateral    Treatment                            11/24/22:  Trigger Point Dry Needling, Manual Therapy Treatment:  Initial or subsequent education regarding Trigger Point Dry Needling: Subsequent Did patient give consent to treatment with Trigger Point Dry Needling: Yes TPDN with skilled palpation and monitoring followed by STM to the following muscles: Lt upper trap, levator, rhomboid  Prone retraction, + GHJ ext Rt sidelying resisted scap protraction/retraction Sidelying Lt ER  Sidelying Lt scapular mobilizations & distraction Supine cervical distraction, bil first rib mobilization, suboccipital release, C0 on C1 AP mobs 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-1 knee on table, ipsilat foot off table and on floor     PATIENT EDUCATION:  Education details:  Teacher, Music of condition, POC, HEP, exercise form/rationale, periodization, precautions and safety Person educated: Patient and Spouse Education method: Explanation, Demonstration, Tactile cues, Verbal cues, and Handouts Education comprehension: verbalized understanding, returned demonstration, verbal cues required, tactile cues required, and needs further education  HOME EXERCISE PROGRAM: [id number]   ASSESSMENT:  CLINICAL IMPRESSION: Pt with limited rotation at the C/S today with 50% limitation to the R. Pt was able to reduce pain to 2/10 and improve to 75% rotation following joint mobs. Self care methods edu provided. Plan to do more isometric abdominal exercise and UE at next. Consider offset carry, modified plank holds, cable walks out, etc at next. Pt would benefit from continued skilled therapy in order to reach goals and maximize functional cervical and lumboelvic strength and ROM for prevention of further functional decline.    REHAB POTENTIAL: Good  CLINICAL DECISION MAKING: Stable/uncomplicated  EVALUATION COMPLEXITY: Low   GOALS: Goals reviewed with patient? Yes  SHORT TERM GOALS: Target  date: 10/26  Able to demo  proper resting posture with awareness of correction of forward head.  Baseline: Goal status: achieved 10/21    LONG TERM GOALS: Target date: POC date  Meet FOTO goal Baseline:  Goal status: MET  2.  AROM cervical rotation to 60 deg bil without discomfort Baseline:  met  3.  Able to participate in activities without limitation by spasm Baseline:  Goal status: ongoing  4.  Bil GHJ ER to 5/5 Baseline:  Goal status: MET  5. Pt will be able to demonstrate DL squat/deadlift with >/=84 lbs in order to demonstrate functional improvement exercise tolerance and exercise technique for transition to independent gym exercise with personal training.  NEW GOAL  6. Pt  will become independent with final HEP in order to demonstrate synthesis of PT education for exercise in gym setting.  NEW GOAL     PLAN:  PT FREQUENCY: 1-2x/week  PT DURATION: 12 wks (d/c in 6-8)   PLANNED INTERVENTIONS: Therapeutic exercises, Therapeutic activity, Neuromuscular re-education, Patient/Family education, Self Care, Joint mobilization, Stair training, Aquatic Therapy, Dry Needling, Electrical stimulation, Spinal mobilization, Cryotherapy, Moist heat, Taping, Ionotophoresis 4mg /ml Dexamethasone , Manual therapy, and Re-evaluation.  PLAN FOR NEXT SESSION: DN PRN, deep neck flexor & periscap strengthening.   Dale Call PT, DPT 02/16/23 8:59 AM

## 2023-02-18 ENCOUNTER — Encounter (HOSPITAL_BASED_OUTPATIENT_CLINIC_OR_DEPARTMENT_OTHER): Payer: HMO | Admitting: Physical Therapy

## 2023-02-23 ENCOUNTER — Encounter (HOSPITAL_BASED_OUTPATIENT_CLINIC_OR_DEPARTMENT_OTHER): Payer: Self-pay | Admitting: Physical Therapy

## 2023-02-23 ENCOUNTER — Ambulatory Visit (HOSPITAL_BASED_OUTPATIENT_CLINIC_OR_DEPARTMENT_OTHER): Payer: HMO | Admitting: Physical Therapy

## 2023-02-23 DIAGNOSIS — M542 Cervicalgia: Secondary | ICD-10-CM

## 2023-02-23 DIAGNOSIS — M62830 Muscle spasm of back: Secondary | ICD-10-CM

## 2023-02-23 NOTE — Therapy (Signed)
OUTPATIENT PHYSICAL THERAPY TREATMENT   Patient Name: LEORY BERI MRN: [id number] DOB:Apr 04, 1950, 73 y.o., male Today's Date: 02/23/2023  END OF SESSION:  PT End of Session - 02/23/23 0845     Visit Number 24    Number of Visits 30    Date for PT Re-Evaluation 04/19/23    Authorization Type HTA    PT Start Time 0801    PT Stop Time 0840    PT Time Calculation (min) 39 min    Activity Tolerance Patient tolerated treatment well    Behavior During Therapy WFL for tasks assessed/performed                          Past Medical History:  Diagnosis Date   Arthritis    Obesity (BMI 30-39.9)    Obstructive sleep apnea    severe- on VPAP auto    Perirectal abscess    Past Surgical History:  Procedure Laterality Date   ANKLE SURGERY     BACK SURGERY     HERNIA REPAIR     umb hernia   INCISION AND DRAINAGE PERIRECTAL ABSCESS  04/08/2011   Procedure: IRRIGATION AND DEBRIDEMENT PERIRECTAL ABSCESS;  Surgeon: Clovis Pu. Cornett, MD;  Location: [facility] OR;  Service: General;  Laterality: N/A;   LIGAMENT REPAIR     right arm ulna   NASAL SINUS SURGERY     Patient Active Problem List   Diagnosis Date Noted   Obstructive sleep apnea    Obesity (BMI 30-39.9)      REFERRING PROVIDER:  Dawley, Izella Ybanez Mulder, DO    REFERRING DIAG: M54.2 (ICD-10-CM) - Cervicalgia   Dry Needling  Rationale for Evaluation and Treatment: Rehabilitation  THERAPY DIAG:  Cervicalgia  Muscle spasm of back  ONSET DATE: subacute on chronic   SUBJECTIVE:                                                                                                                                                                                           SUBJECTIVE STATEMENT: Pt states he is unable to turn his head today. Shoveling that snow increased the R sided neck pain. He is unable to rotate and looking down is very painful .  PERTINENT HISTORY:  L5 S1 fusion bony, L3-4 plate fusion  PAIN:  Are  you having pain? Yes: NPRS scale: 4/10 Pain location: R neck and shoulder  Pain description: spasms, general discomfort Aggravating factors: looking up Relieving factors: ice  PRECAUTIONS:  None  RED FLAGS: None   WEIGHT BEARING RESTRICTIONS:  No  FALLS:  Has patient fallen in last  6 months? 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  DIAGNOSTIC FINDINGS:  No recent imaging available  PATIENT SURVEYS:  FOTO 50  11/11 FOTO 61  12/16  FOTO 66% (GOAL  MET)  ODI  Modified Oswestry Low Back Pain Disability Questionnaire: 20 / 50 = 40.0 %  POSTURE:  forward head, decreased lumbar lordosis, and decreased thoracic kyphosis  HAND DOMINANCE:  Right   Body Part #1 Cervical   CERVICAL ROM:   Active ROM A/PROM (deg) eval 11/13/22 10/14 11/20/22 11/4 11/11 12/16  Flexion 18 35  41  50 55  Extension 22 35  42  42 50  Right lateral flexion 14 20  24  improves to 25 26 27 30   Left lateral flexion 18 18  19  improves to 25 26 28 30   Right rotation 50 52 62 after manual 64 improved to 73 66 68 69  Left rotation 52 55 62 after manual 70 improved to 73 68 69 68   (Blank rows = not tested)  MMT Right 12/16  Left 12/16  Shoulder flexion 5 5  Shoulder extension    Shoulder abduction 5 5  Shoulder adduction    Shoulder internal rotation 5 5  Shoulder external rotation 5 5  (Blank rows = not tested)  LUMBAR ROM:   Active  A/PROM  12/16  Flexion 50%  Extension 10% p!  Right lateral flexion 25%   Left lateral flexion 25%  Right rotation 25% p!  Left rotation 25% p!    (Blank rows = not tested)      TREATMENT:   1/20 Trigger Point Dry Needling  Subsequent Treatment: Instructions provided previously at initial dry needling treatment.   Patient Verbal  Consent Given: Yes Education Handout Provided: Previously Provided Muscles Treated: R C/S paraspinals Electrical Stimulation Performed: No Treatment Response/Outcome: LTR elicited, soft tissue extensibility increase    CPA and UPA C3-6 grade III-IV STM R C/S paraspinals  Chest supported row set up Machine chest press set up Lat pull down set up Avoiding of OHP or axial loading type exercise       1/13  CPA and UPA C3-6 grade III-IV Grade III L and R SB mob STM L and R UT; R levator, R rhomboid  Self mobilization for C/S Self STM Massage therapy   1/6  Periscapular STM in shoulder IR position (underside of scap) T/S paraspinals Child's pose with lat stretch 5s 10x Lat stretch with doorway 30s 2x YTB band paloff 2x10  Self STM with ball  Lumbar ext at wall   12/30  Periscapular STM in shoulder IR position (underside of scap) STM L lat STM R UT and levator     R UPA and CPA of C3-6 grade III R SB and glide mob grade III  Self release with tennis ball Lat stretch and uppper back stretch  12/23  Nustep lvl 5 5 min  Bent over row 13lb KB 2x10 TRX deep squat 10x 3s holds Bird dog 3x20 Paloff 10x with purple band; 10x 12lb cable  Deadlift to box 13lbs KB 5x5  12/16  Edu of exam results  Walk through of the following gym exercise: demo of technique, set up, and set/rep range; exercise precaution, indications and contra-indications  Bent over row TRX deep squat  [employer] laboratory technician Deadlift to box   12/9  STM R posterior hip, passive release Lumbar grade II UPA on R STM R QL     Thermotherapy, active recovery management, self pain management, massage therapy options and practitioners   12/4  Nustep warm up 5 min lvl 5  nerve glide 2x10 seated with ankle IV for L4-5 Supine bridge with march 2x20 (up the entire time) Bird dog 2x20 Cable hip hinge/ext 10x 15lbs, 2x10 30lbs  Cable row 3x8 27.5lbs (unilateral cable)  YTB sidestepping 2x  hallway lap Shuttle leg press (set up edu) 125 lbs 3x10    12/2   STM R posterior hip, passive release; STM bilat QL   Supine nerve glide 2x10 5lb goblet STS 4x6-8 Supine hip ABD with bridge 2x10 2s YTB sidestepping 2x hallway lap YTB retro step 2x hallway laps   11/27  STM R posterior hip, passive release  Supine nerve glide 2x10 Supine piriformis 4x 10s each Supine hip ABD iso 2x3 10s   Thermotherapy, active recovery management   11/20  STM and R UT and L/S  Seated table T/S ext 10x  Cable row 3x8 35lbs Cable shoulder extension 20lbs 2x10 Cable face pull 20lbs 2x10  11/18  CPA and UPA T3-12 grade III-IV  STM T/S paraspinals  Seated T/S ext 10x 3s  Open book 10x each side 5s Seated T/S ext with towel roll 5s 2x10    11/13  Trigger Point Dry-Needling  Treatment instructions: Expect mild to moderate muscle soreness. S/S of pneumothorax if dry needled over a lung field, and to seek immediate medical attention should they occur. Patient verbalized understanding of these instructions and education.   Patient Consent Given: Yes Education (verbally/handout)provided: Yes Muscles treated: bilat L3-4 paraspinals with shelfing technique; R C4-7 paraspinals with shelfing Electrical stimulation performed: no Parameters:   N/A Treatment response/outcome: twitch response, elicited, decrease in soft tissue tension  STM bilat QL and L1-5 paraspinals Prone R UPA grade III-IV R C3-6  Curl up 3s 2x10 Self STM techniques Rocabado exercises for jaw pain (hand out provided) Postural changes to reduce TMD; chin tucking, jaw neutral resting position   Treatment                            11/11  Trigger Point Dry-Needling  Treatment instructions: Expect mild to moderate muscle soreness. S/S of pneumothorax if dry needled over a lung field, and to seek immediate medical attention should they occur. Patient verbalized understanding of these instructions and education.    Patient Consent Given: Yes Education (verbally/handout)provided: Yes Muscles treated: bilat L3-4 paraspinals with shelfing technique Electrical stimulation performed: no Parameters:   N/A Treatment response/outcome: twitch response, elicited, decrease in soft tissue tension  STM bilat QL and L1-5 paraspinals  STM L UT, bilat cervical paraspinals UPA grade III-IV bilat C3-6, inf medial glide grade IV same levels Manual traction    Treatment                            11/6  Trigger Point Dry-Needling  Treatment instructions: Expect mild to moderate muscle soreness. S/S of pneumothorax if dry needled over a lung field, and to seek immediate medical attention should they occur. Patient verbalized understanding of these instructions and education.   Patient Consent Given: Yes Education (verbally/handout)provided: Yes Muscles treated: bilat L3-4 paraspinals with shelfing technique Electrical stimulation performed: no Parameters:  N/A Treatment response/outcome: twitch response, elicited, decrease in soft tissue tension  STM bilat QL and L1-5 paraspinals  Review of hip hinging with dowel at wall and to chair Lifting mechanics discussion   Treatment                            11/4:  Trigger Point Dry Needling, Manual Therapy Treatment:  Initial or subsequent education regarding Trigger Point Dry Needling: Subsequent Did patient give consent to treatment with Trigger Point Dry Needling: Yes TPDN with skilled palpation and monitoring followed by STM to the following muscles: Rt glut max/med, Lt thoracic paraspinals followed by mid-thoracic rib mobs bilaterally  Seated piriformis stretch Standing squat deadlift with blue tband Seated hip hinge with bar- progressed to sit<>stand, to table taps Seated hamstring stretch Standing horizontal abduction lift from wall    10/28 STM L UT, bilat cervical paraspinals UPA grade III-IV bilat C3-6, inf medial glide grade IV same  levels [place] traction Manual traction with chin tuck 2x10  Supine chin tuck with rotation 2x10 RTB shrug with head tilt (loaded eccentric, light) 2x10 SNAG cervical rotation with pillowcase 2x10 bilat                                                                                                                       11/27/22 Manual: STM to cervical paraspinals, Grade II-III R and L UPA C3-C7, manual traction, L lateral glide C3-C7, UT stretch Prone shoulder extension 3# 2 x 15 Prone shoulder horizontal abduction 3# 2 x 10  Open book short lever arm shoulder 1 x 10 with 5 seconds bilateral    Treatment                            11/24/22:  Trigger Point Dry Needling, Manual Therapy Treatment:  Initial or subsequent education regarding Trigger Point Dry Needling: Subsequent Did patient give consent to treatment with Trigger Point Dry Needling: Yes TPDN with skilled palpation and monitoring followed by STM to the following muscles: Lt upper trap, levator, rhomboid  Prone retraction, + GHJ ext Rt sidelying resisted scap protraction/retraction Sidelying Lt ER  Sidelying Lt scapular mobilizations & distraction Supine cervical distraction, bil first rib mobilization, suboccipital release, C0 on C1 AP mobs 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-1 knee on table, ipsilat foot off table and on floor     PATIENT EDUCATION:  Education details:  Teacher, music of condition, POC, HEP, exercise form/rationale, periodization, precautions and safety Person educated: Patient and Spouse Education method: Explanation, Demonstration, Tactile cues, Verbal cues, and Handouts Education comprehension: verbalized understanding, returned demonstration, verbal cues required, tactile cues required, and needs further education  HOME EXERCISE PROGRAM: [id number]   ASSESSMENT:  CLINICAL IMPRESSION: Pt C/S pain returns on R side that improves with TPDN and manual therapy with normal R rotation. Levator  stretch introduced along with machine based strengthening to control degrees of  freedom. Precuations discussed and HEP updated. Plan for visit at next session to be progress note and potential D/C to gym program depending on pts pain. Pt would benefit from continued skilled therapy in order to reach goals and maximize functional cervical and lumboelvic strength and ROM for prevention of further functional decline.    REHAB POTENTIAL: Good  CLINICAL DECISION MAKING: Stable/uncomplicated  EVALUATION COMPLEXITY: Low   GOALS: Goals reviewed with patient? Yes  SHORT TERM GOALS: Target date: 10/26  Able to demo proper resting posture with awareness of correction of forward head.  Baseline: Goal status: achieved 10/21    LONG TERM GOALS: Target date: POC date  Meet FOTO goal Baseline:  Goal status: MET  2.  AROM cervical rotation to 60 deg bil without discomfort Baseline:  met  3.  Able to participate in activities without limitation by spasm Baseline:  Goal status: ongoing  4.  Bil GHJ ER to 5/5 Baseline:  Goal status: MET  5. Pt will be able to demonstrate DL squat/deadlift with >/=78 lbs in order to demonstrate functional improvement exercise tolerance and exercise technique for transition to independent gym exercise with personal training.  NEW GOAL  6. Pt  will become independent with final HEP in order to demonstrate synthesis of PT education for exercise in gym setting.  NEW GOAL     PLAN:  PT FREQUENCY: 1-2x/week  PT DURATION: 12 wks (d/c in 6-8)   PLANNED INTERVENTIONS: Therapeutic exercises, Therapeutic activity, Neuromuscular re-education, Patient/Family education, Self Care, Joint mobilization, Stair training, Aquatic Therapy, Dry Needling, Electrical stimulation, Spinal mobilization, Cryotherapy, Moist heat, Taping, Ionotophoresis 4mg /ml Dexamethasone, Manual therapy, and Re-evaluation.  PLAN FOR NEXT SESSION: DN PRN, deep neck flexor & periscap  strengthening.   Zebedee Iba PT, DPT 02/23/23 8:48 AM

## 2023-02-25 ENCOUNTER — Encounter (HOSPITAL_BASED_OUTPATIENT_CLINIC_OR_DEPARTMENT_OTHER): Payer: HMO | Admitting: Physical Therapy

## 2023-03-02 ENCOUNTER — Ambulatory Visit (HOSPITAL_BASED_OUTPATIENT_CLINIC_OR_DEPARTMENT_OTHER): Payer: HMO | Admitting: Physical Therapy

## 2023-03-02 ENCOUNTER — Encounter (HOSPITAL_BASED_OUTPATIENT_CLINIC_OR_DEPARTMENT_OTHER): Payer: Self-pay | Admitting: Physical Therapy

## 2023-03-02 DIAGNOSIS — M542 Cervicalgia: Secondary | ICD-10-CM

## 2023-03-02 DIAGNOSIS — M62830 Muscle spasm of back: Secondary | ICD-10-CM

## 2023-03-02 NOTE — Therapy (Signed)
OUTPATIENT PHYSICAL THERAPY TREATMENT   Patient Name: Eric Cochran MRN: [id number] DOB:09-02-1950, 73 y.o., male Today's Date: 03/02/2023  END OF SESSION:  PT End of Session - 03/02/23 0847     Visit Number 25    Number of Visits 30    Date for PT Re-Evaluation 04/19/23    Authorization Type HTA    PT Start Time 0803    PT Stop Time 0842    PT Time Calculation (min) 39 min    Activity Tolerance Patient tolerated treatment well    Behavior During Therapy [facility] for tasks assessed/performed                           Past Medical History:  Diagnosis Date   Arthritis    Obesity (BMI 30-39.9)    Obstructive sleep apnea    severe- on VPAP auto    Perirectal abscess    Past Surgical History:  Procedure Laterality Date   ANKLE SURGERY     BACK SURGERY     HERNIA REPAIR     umb hernia   INCISION AND DRAINAGE PERIRECTAL ABSCESS  04/08/2011   Procedure: IRRIGATION AND DEBRIDEMENT PERIRECTAL ABSCESS;  Surgeon: Clovis Pu. Cornett, MD;  Location: [facility] OR;  Service: General;  Laterality: N/A;   LIGAMENT REPAIR     right arm ulna   NASAL SINUS SURGERY     Patient Active Problem List   Diagnosis Date Noted   Obstructive sleep apnea    Obesity (BMI 30-39.9)      REFERRING PROVIDER:  Dawley, Sameul Tagle Mulder, DO    REFERRING DIAG: M54.2 (ICD-10-CM) - Cervicalgia   Dry Needling  Rationale for Evaluation and Treatment: Rehabilitation  THERAPY DIAG:  Cervicalgia  Muscle spasm of back  ONSET DATE: subacute on chronic   SUBJECTIVE:                                                                                                                                                                                           SUBJECTIVE STATEMENT: Pt states that the neck is very tight today. Turning his head is very uncomfortable and painful. He did some activity with reaching into boxes at work but the middle back pain did not allow him to raise his arm OH afterwards.    PERTINENT HISTORY:  L5 S1 fusion bony, L3-4 plate fusion  PAIN:  Are you having pain? Yes: NPRS scale: 4/10 Pain location: R neck and shoulder  Pain description: spasms, general discomfort Aggravating factors: looking up Relieving factors: ice  PRECAUTIONS:  None  RED FLAGS: None  WEIGHT BEARING RESTRICTIONS:  No  FALLS:  Has patient fallen in last 6 months? 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  DIAGNOSTIC FINDINGS:  No recent imaging available  PATIENT SURVEYS:  FOTO 50  11/11 FOTO 61  12/16  FOTO 66% (GOAL  MET)  ODI  Modified Oswestry Low Back Pain Disability Questionnaire: 20 / 50 = 40.0 %    POSTURE:  forward head, decreased lumbar lordosis, and decreased thoracic kyphosis  HAND DOMINANCE:  Right   Body Part #1 Cervical   CERVICAL ROM:   Active ROM A/PROM (deg) eval 11/13/22 10/14 11/20/22 11/4 11/11 12/16  Flexion 18 35  41  50 55  Extension 22 35  42  42 50  Right lateral flexion 14 20  24  improves to 25 26 27 30   Left lateral flexion 18 18  19  improves to 25 26 28 30   Right rotation 50 52 62 after manual 64 improved to 73 66 68 69  Left rotation 52 55 62 after manual 70 improved to 73 68 69 68   (Blank rows = not tested)  MMT Right 12/16  Left 12/16  Shoulder flexion 5 5  Shoulder extension    Shoulder abduction 5 5  Shoulder adduction    Shoulder internal rotation 5 5  Shoulder external rotation 5 5  (Blank rows = not tested)  LUMBAR ROM:   Active  A/PROM  12/16  Flexion 50%  Extension 10% p!  Right lateral flexion 25%   Left lateral flexion 25%  Right rotation 25% p!  Left rotation 25% p!    (Blank rows = not tested)      TREATMENT:   1/27  Trigger Point Dry Needling  Subsequent Treatment:  Instructions provided previously at initial dry needling treatment.   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   Patient Verbal Consent Given: Yes Education Handout Provided: Previously Provided Muscles Treated: R C/S paraspinals Electrical Stimulation Performed: No Treatment Response/Outcome: LTR elicited, soft tissue extensibility increase    CPA and UPA C3-6 grade III-IV STM R C/S paraspinals  Chest supported row set up Machine chest press set up Lat pull down set up Avoiding of OHP or axial loading type exercise       1/13  CPA and UPA C3-6 grade III-IV Grade III L and R SB mob STM L and R UT; R levator, R rhomboid  Self mobilization for C/S Self STM Massage therapy   1/6  Periscapular STM in shoulder IR position (underside of scap) T/S paraspinals Child's pose with lat stretch 5s 10x Lat stretch with doorway 30s 2x YTB band paloff 2x10  Self STM with ball  Lumbar ext at wall   12/30  Periscapular STM in shoulder IR position (underside of scap) STM L lat STM R UT and levator     R UPA and CPA of C3-6  grade III R SB and glide mob grade III  Self release with tennis ball Lat stretch and uppper back stretch  12/23  Nustep lvl 5 5 min  Bent over row 13lb KB 2x10 TRX deep squat 10x 3s holds Bird dog 3x20 Paloff 10x with purple band; 10x 12lb cable  Deadlift to box 13lbs KB 5x5  12/16  Edu of exam results  Walk through of the following gym exercise: demo of technique, set up, and set/rep range; exercise  precaution, indications and contra-indications  Bent over row TRX deep squat Bird dog Paloff Deadlift to box   12/9  STM R posterior hip, passive release Lumbar grade II UPA on R STM R QL     Thermotherapy, active recovery management, self pain management, massage therapy options and practitioners   12/4  Nustep warm up 5 min lvl 5  nerve glide 2x10 seated with ankle IV for L4-5 Supine bridge with march 2x20 (up the entire time) Bird dog 2x20 Cable hip hinge/ext 10x 15lbs, 2x10 30lbs  Cable row 3x8 27.5lbs (unilateral cable)  YTB sidestepping 2x hallway lap Shuttle leg press (set up edu) 125 lbs 3x10    12/2   STM R posterior hip, passive release; STM bilat QL   Supine nerve glide 2x10 5lb goblet STS 4x6-8 Supine hip ABD with bridge 2x10 2s YTB sidestepping 2x hallway lap YTB retro step 2x hallway laps   11/27  STM R posterior hip, passive release  Supine nerve glide 2x10 Supine piriformis 4x 10s each Supine hip ABD iso 2x3 10s   Thermotherapy, active recovery management   11/20  STM and R UT and L/S  Seated table T/S ext 10x  Cable row 3x8 35lbs Cable shoulder extension 20lbs 2x10 Cable face pull 20lbs 2x10  11/18  CPA and UPA T3-12 grade III-IV  STM T/S paraspinals  Seated T/S ext 10x 3s  Open book 10x each side 5s Seated T/S ext with towel roll 5s 2x10    11/13  Trigger Point Dry-Needling  Treatment instructions: Expect mild to moderate muscle soreness. S/S of pneumothorax if dry needled over a lung field, and to seek immediate medical attention should they occur. Patient verbalized understanding of these instructions and education.   Patient Consent Given: Yes Education (verbally/handout)provided: Yes Muscles treated: bilat L3-4 paraspinals with shelfing technique; R C4-7 paraspinals with shelfing Electrical stimulation performed: no Parameters:   N/A Treatment response/outcome: twitch response, elicited, decrease in soft tissue  tension  STM bilat QL and L1-5 paraspinals Prone R UPA grade III-IV R C3-6  Curl up 3s 2x10 Self STM techniques [place]ado exercises for jaw pain (hand out provided) Postural changes to reduce TMD; chin tucking, jaw neutral resting position   Treatment                            11/11  Trigger Point Dry-Needling  Treatment instructions: Expect mild to moderate muscle soreness. S/S of pneumothorax if dry needled over a lung field, and to seek immediate medical attention should they occur. Patient verbalized understanding of these instructions and education.   Patient Consent Given: Yes Education (verbally/handout)provided: Yes Muscles treated: bilat L3-4 paraspinals with shelfing technique Electrical stimulation performed: no Parameters:   N/A Treatment response/outcome: twitch response, elicited, decrease in soft tissue tension  STM bilat QL and L1-5 paraspinals  STM L UT, bilat cervical paraspinals UPA grade III-IV bilat C3-6, inf medial glide grade IV same levels Manual traction  Treatment                            11/6  Trigger Point Dry-Needling  Treatment instructions: Expect mild to moderate muscle soreness. S/S of pneumothorax if dry needled over a lung field, and to seek immediate medical attention should they occur. Patient verbalized understanding of these instructions and education.   Patient Consent Given: Yes Education (verbally/handout)provided: Yes Muscles treated: bilat L3-4 paraspinals with shelfing technique Electrical stimulation performed: no Parameters:   N/A Treatment response/outcome: twitch response, elicited, decrease in soft tissue tension  STM bilat QL and L1-5 paraspinals  Review of hip hinging with dowel at wall and to chair Lifting mechanics discussion   Treatment                            11/4:  Trigger Point Dry Needling, Manual Therapy Treatment:  Initial or subsequent education regarding Trigger Point Dry Needling: Subsequent Did  patient give consent to treatment with Trigger Point Dry Needling: Yes TPDN with skilled palpation and monitoring followed by STM to the following muscles: Rt glut max/med, Lt thoracic paraspinals followed by mid-thoracic rib mobs bilaterally  Seated piriformis stretch Standing squat deadlift with blue tband Seated hip hinge with bar- progressed to sit<>stand, to table taps Seated hamstring stretch Standing horizontal abduction lift from wall    10/28 STM L UT, bilat cervical paraspinals UPA grade III-IV bilat C3-6, inf medial glide grade IV same levels [place] traction Manual traction with chin tuck 2x10  Supine chin tuck with rotation 2x10 RTB shrug with head tilt (loaded eccentric, light) 2x10 SNAG cervical rotation with pillowcase 2x10 bilat                                                                                                                       11/27/22 Manual: STM to cervical paraspinals, Grade II-III R and L UPA C3-C7, manual traction, L lateral glide C3-C7, UT stretch Prone shoulder extension 3# 2 x 15 Prone shoulder horizontal abduction 3# 2 x 10  Open book short lever arm shoulder 1 x 10 with 5 seconds bilateral    Treatment                            11/24/22:  Trigger Point Dry Needling, Manual Therapy Treatment:  Initial or subsequent education regarding Trigger Point Dry Needling: Subsequent Did patient give consent to treatment with Trigger Point Dry Needling: Yes TPDN with skilled palpation and monitoring followed by STM to the following muscles: Lt upper trap, levator, rhomboid  Prone retraction, + GHJ ext Rt sidelying resisted scap protraction/retraction Sidelying Lt ER  Sidelying Lt scapular mobilizations & distraction Supine cervical distraction, bil first rib mobilization, suboccipital release, C0 on C1 AP mobs Wall push up plus [employer] yellow tband-1 knee on table, ipsilat foot off table and on floor  PATIENT EDUCATION:  Education  details:  Anatomy of condition, POC, HEP, exercise form/rationale, periodization, precautions and safety Person educated: Patient and Spouse Education method: Explanation, Demonstration, Tactile cues, Verbal cues, and Handouts Education comprehension: verbalized understanding, returned demonstration, verbal cues required, tactile cues required, and needs further education  HOME EXERCISE PROGRAM: [id number]   ASSESSMENT:  CLINICAL IMPRESSION: Pt returns to session with increase in pain and decreased C/S mobility with inability to turn beyond 60 deg bilaterally. Unable to successfully perform TPDN due to excessive soft tissue hypertoncity. Session used for pain management. Motion improves slightly but pain persists. Pt advised on activity modifications and reduction of stretching intensity as pt reports painful end range but pushing through discomfort. Plan to TPDN and manual PRN at next session and then f/u with 1 visit in 2-3 wks for gym program D/C as planned previously. Pt would benefit from continued skilled therapy in order to reach goals and maximize functional cervical and lumboelvic strength and ROM for prevention of further functional decline.    REHAB POTENTIAL: Good  CLINICAL DECISION MAKING: Stable/uncomplicated  EVALUATION COMPLEXITY: Low   GOALS: Goals reviewed with patient? Yes  SHORT TERM GOALS: Target date: 10/26  Able to demo proper resting posture with awareness of correction of forward head.  Baseline: Goal status: achieved 10/21    LONG TERM GOALS: Target date: POC date  Meet FOTO goal Baseline:  Goal status: MET  2.  AROM cervical rotation to 60 deg bil without discomfort Baseline:  met  3.  Able to participate in activities without limitation by spasm Baseline:  Goal status: ongoing  4.  Bil GHJ ER to 5/5 Baseline:  Goal status: MET  5. Pt will be able to demonstrate DL squat/deadlift with >/=10 lbs in order to demonstrate functional improvement  exercise tolerance and exercise technique for transition to independent gym exercise with personal training.  NEW GOAL  6. Pt  will become independent with final HEP in order to demonstrate synthesis of PT education for exercise in gym setting.  NEW GOAL     PLAN:  PT FREQUENCY: 1-2x/week  PT DURATION: 12 wks (d/c in 6-8)   PLANNED INTERVENTIONS: Therapeutic exercises, Therapeutic activity, Neuromuscular re-education, Patient/Family education, Self Care, Joint mobilization, Stair training, Aquatic Therapy, Dry Needling, Electrical stimulation, Spinal mobilization, Cryotherapy, Moist heat, Taping, Ionotophoresis 4mg /ml Dexamethasone, Manual therapy, and Re-evaluation.  PLAN FOR NEXT SESSION: DN PRN, deep neck flexor & periscap strengthening.   Zebedee Iba PT, DPT 03/02/23 8:51 AM

## 2023-03-04 ENCOUNTER — Encounter (HOSPITAL_BASED_OUTPATIENT_CLINIC_OR_DEPARTMENT_OTHER): Payer: HMO | Admitting: Physical Therapy

## 2023-03-05 ENCOUNTER — Other Ambulatory Visit (HOSPITAL_COMMUNITY)
Admission: RE | Admit: 2023-03-05 | Discharge: 2023-03-05 | Disposition: A | Discharge status: Home / Self Care | Payer: Self-pay | Source: Ambulatory Visit | Attending: Oncology | Admitting: Oncology

## 2023-03-11 ENCOUNTER — Ambulatory Visit (HOSPITAL_BASED_OUTPATIENT_CLINIC_OR_DEPARTMENT_OTHER): Payer: HMO | Attending: Neurological Surgery | Admitting: Physical Therapy

## 2023-03-11 ENCOUNTER — Encounter (HOSPITAL_BASED_OUTPATIENT_CLINIC_OR_DEPARTMENT_OTHER): Payer: Self-pay | Admitting: Physical Therapy

## 2023-03-11 DIAGNOSIS — M542 Cervicalgia: Secondary | ICD-10-CM | POA: Insufficient documentation

## 2023-03-11 DIAGNOSIS — M62830 Muscle spasm of back: Secondary | ICD-10-CM | POA: Diagnosis present

## 2023-03-11 NOTE — Therapy (Signed)
 OUTPATIENT PHYSICAL THERAPY TREATMENT   Patient Name: Eric Cochran MRN: [id number] DOB:1950/10/11, 73 y.o., male Today's Date: 03/11/2023  END OF SESSION:  PT End of Session - 03/11/23 1607     Visit Number 26    Number of Visits 30    Date for PT Re-Evaluation 04/19/23    Authorization Type HTA    PT Start Time 1530    PT Stop Time 1603    PT Time Calculation (min) 33 min    Activity Tolerance Patient tolerated treatment well    Behavior During Therapy WFL for tasks assessed/performed                            Past Medical History:  Diagnosis Date   Arthritis    Obesity (BMI 30-39.9)    Obstructive sleep apnea    severe- on VPAP auto    Perirectal abscess    Past Surgical History:  Procedure Laterality Date   ANKLE SURGERY     BACK SURGERY     HERNIA REPAIR     umb hernia   INCISION AND DRAINAGE PERIRECTAL ABSCESS  04/08/2011   Procedure: IRRIGATION AND DEBRIDEMENT PERIRECTAL ABSCESS;  Surgeon: Debby LABOR. Cornett, MD;  Location: [facility] OR;  Service: General;  Laterality: N/A;   LIGAMENT REPAIR     right arm ulna   NASAL SINUS SURGERY     Patient Active Problem List   Diagnosis Date Noted   Obstructive sleep apnea    Obesity (BMI 30-39.9)      REFERRING PROVIDER:  Dawley, Troy C, DO    REFERRING DIAG: M54.2 (ICD-10-CM) - Cervicalgia   Dry Needling  Rationale for Evaluation and Treatment: Rehabilitation  THERAPY DIAG:  Cervicalgia  Muscle spasm of back  ONSET DATE: subacute on chronic   SUBJECTIVE:                                                                                                                                                                                           SUBJECTIVE STATEMENT: Pt states that the turning of the neck is better but turning the head is very painful. He winces at the pain when driving.   PERTINENT HISTORY:  L5 S1 fusion bony, L3-4 plate fusion  PAIN:  Are you having pain? Yes: NPRS scale:  8/10 Pain location: neck  Pain description: spasms, general discomfort Aggravating factors: looking up Relieving factors: ice  PRECAUTIONS:  None  RED FLAGS: None   WEIGHT BEARING RESTRICTIONS:  No  FALLS:  Has patient fallen in last 6 months? 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  DIAGNOSTIC FINDINGS:  No recent imaging available  PATIENT SURVEYS:  FOTO 50  11/11 FOTO 61  12/16  FOTO 66% (GOAL  MET)  ODI  Modified Oswestry Low Back Pain Disability Questionnaire: 20 / 50 = 40.0 %    POSTURE:  forward head, decreased lumbar lordosis, and decreased thoracic kyphosis  HAND DOMINANCE:  Right   Body Part #1 Cervical   CERVICAL ROM:   Active ROM A/PROM (deg) eval 11/13/22 10/14 11/20/22 11/4 11/11 12/16  Flexion 18 35  41  50 55  Extension 22 35  42  42 50  Right lateral flexion 14 20  24  improves to 25 26 27 30   Left lateral flexion 18 18  19  improves to 25 26 28 30   Right rotation 50 52 62 after manual 64 improved to 73 66 68 69  Left rotation 52 55 62 after manual 70 improved to 73 68 69 68   (Blank rows = not tested)  MMT Right 12/16  Left 12/16  Shoulder flexion 5 5  Shoulder extension    Shoulder abduction 5 5  Shoulder adduction    Shoulder internal rotation 5 5  Shoulder external rotation 5 5  (Blank rows = not tested)  LUMBAR ROM:   Active  A/PROM  12/16  Flexion 50%  Extension 10% p!  Right lateral flexion 25%   Left lateral flexion 25%  Right rotation 25% p!  Left rotation 25% p!    (Blank rows = not tested)      TREATMENT:   2/5   Trigger Point Dry Needling  Subsequent Treatment: Instructions provided previously at initial dry needling treatment.   Patient Verbal Consent Given: Yes Attempted to needle C/S  paraspinals; hypertonicity bends needle- clinician not able to perform TPDN    CPA and UPA C3-6 grade III-IV STM bilat C/S paraspinals  Cervical isometrics 3s 10x each direction    1/27  Trigger Point Dry Needling  Subsequent Treatment: Instructions provided previously at initial dry needling treatment.   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   Patient Verbal Consent Given: Yes Education Handout Provided: Previously Provided Muscles Treated: R C/S paraspinals Electrical Stimulation Performed: No Treatment Response/Outcome: LTR elicited, soft tissue extensibility increase    CPA and UPA C3-6 grade III-IV STM R C/S paraspinals  Chest supported row set up Machine chest press set up Lat pull down set up Avoiding of OHP or axial loading type exercise       1/13  CPA and UPA C3-6 grade III-IV Grade III L and R SB mob STM L and R UT; R levator, R rhomboid  Self mobilization for C/S Self STM Massage therapy   1/6  Periscapular STM in shoulder IR position (underside of scap) T/S paraspinals Child's pose with lat stretch 5s 10x Lat stretch with doorway 30s 2x YTB band paloff 2x10  Self STM with ball  Lumbar ext at wall   12/30  Periscapular STM in shoulder IR position (underside of scap) STM L lat STM R UT and levator     R UPA and CPA of C3-6 grade III R SB and glide mob grade III  Self release with tennis ball Lat stretch and uppper back stretch  12/23  Nustep lvl 5 5 min  Bent over row 13lb KB 2x10 TRX deep  squat 10x 3s holds Bird dog 3x20 Paloff 10x with purple band; 10x 12lb cable  Deadlift to box 13lbs KB 5x5  12/16  Edu of exam results  Walk through of the following gym exercise: demo of technique, set up, and set/rep range; exercise precaution, indications and contra-indications  Bent over row TRX deep squat Bird dog Paloff Deadlift to box   12/9  STM R posterior hip, passive release Lumbar grade II UPA on R STM R QL     Thermotherapy, active recovery management, self pain management, massage therapy options and practitioners   12/4  Nustep warm up 5 min lvl 5  nerve glide 2x10 seated with ankle IV for L4-5 Supine bridge with march 2x20 (up the entire time) Bird dog 2x20 Cable hip hinge/ext 10x 15lbs, 2x10 30lbs  Cable row 3x8 27.5lbs (unilateral cable)  YTB sidestepping 2x hallway lap Shuttle leg press (set up edu) 125 lbs 3x10    12/2   STM R posterior hip, passive release; STM bilat QL   Supine nerve glide 2x10 5lb goblet STS 4x6-8 Supine hip ABD with bridge 2x10 2s YTB sidestepping 2x hallway lap YTB retro step 2x hallway laps   11/27  STM R posterior hip, passive release  Supine nerve glide 2x10 Supine piriformis 4x 10s each Supine hip ABD iso 2x3 10s   Thermotherapy, active recovery management   11/20  STM and R UT and L/S  Seated table T/S ext 10x  Cable row 3x8 35lbs Cable shoulder extension 20lbs 2x10 Cable face pull 20lbs 2x10  11/18  CPA and UPA T3-12 grade III-IV  STM T/S paraspinals  Seated T/S ext 10x 3s  Open book 10x each side 5s Seated T/S ext with towel roll 5s 2x10    11/13  Trigger Point Dry-Needling  Treatment instructions: Expect mild to moderate muscle soreness. S/S of pneumothorax if dry needled over a lung field, and to seek immediate medical attention should they occur. Patient verbalized understanding of these instructions and education.   Patient Consent Given: Yes Education  (verbally/handout)provided: Yes Muscles treated: bilat L3-4 paraspinals with shelfing technique; R C4-7 paraspinals with shelfing Electrical stimulation performed: no Parameters:   N/A Treatment response/outcome: twitch response, elicited, decrease in soft tissue tension  STM bilat QL and L1-5 paraspinals Prone R UPA grade III-IV R C3-6  Curl up 3s 2x10 Self STM techniques [place]abado exercises for jaw pain (hand out provided) Postural changes to reduce TMD; chin tucking, jaw neutral resting position   Treatment                            11/11  Trigger Point Dry-Needling  Treatment instructions: Expect mild to moderate muscle soreness. S/S of pneumothorax if dry needled over a lung field, and to seek immediate medical attention should they occur. Patient verbalized understanding of these instructions and education.   Patient Consent Given: Yes Education (verbally/handout)provided: Yes Muscles treated: bilat L3-4 paraspinals with shelfing technique Electrical stimulation performed: no Parameters:  N/A Treatment response/outcome: twitch response, elicited, decrease in soft tissue tension  STM bilat QL and L1-5 paraspinals  STM L UT, bilat cervical paraspinals UPA grade III-IV bilat C3-6, inf medial glide grade IV same levels Manual traction    Treatment                            11/6  Trigger Point Dry-Needling  Treatment instructions: Expect mild to moderate muscle soreness. S/S of pneumothorax if dry needled over a lung field, and to seek immediate medical attention should they occur. Patient verbalized understanding of these instructions and education.   Patient Consent Given: Yes Education (verbally/handout)provided: Yes Muscles treated: bilat L3-4 paraspinals with shelfing technique Electrical stimulation performed: no Parameters:   N/A Treatment response/outcome: twitch response, elicited, decrease in soft tissue tension  STM bilat QL and L1-5 paraspinals  Review  of hip hinging with dowel at wall and to chair Lifting mechanics discussion   Treatment                            11/4:  Trigger Point Dry Needling, Manual Therapy Treatment:  Initial or subsequent education regarding Trigger Point Dry Needling: Subsequent Did patient give consent to treatment with Trigger Point Dry Needling: Yes TPDN with skilled palpation and monitoring followed by STM to the following muscles: Rt glut max/med, Lt thoracic paraspinals followed by mid-thoracic rib mobs bilaterally  Seated piriformis stretch Standing squat deadlift with blue tband Seated hip hinge with bar- progressed to sit<>stand, to table taps Seated hamstring stretch Standing horizontal abduction lift from wall    10/28 STM L UT, bilat cervical paraspinals UPA grade III-IV bilat C3-6, inf medial glide grade IV same levels [place] traction Manual traction with chin tuck 2x10  Supine chin tuck with rotation 2x10 RTB shrug with head tilt (loaded eccentric, light) 2x10 SNAG cervical rotation with pillowcase 2x10 bilat                                                                                                                       11/27/22 Manual: STM to cervical paraspinals, Grade II-III R and L UPA C3-C7, manual traction, L lateral glide C3-C7, UT stretch Prone shoulder extension 3# 2 x 15 Prone shoulder horizontal abduction 3# 2 x 10  Open book short lever arm shoulder 1 x 10 with 5 seconds bilateral    Treatment                            11/24/22:  Trigger Point Dry Needling, Manual Therapy Treatment:  Initial or subsequent education regarding Trigger Point Dry Needling: Subsequent Did patient give consent to treatment with Trigger Point Dry Needling: Yes TPDN with skilled palpation and monitoring followed by STM to the following muscles: Lt upper trap, levator, rhomboid  Prone retraction, + GHJ ext Rt sidelying resisted scap protraction/retraction Sidelying  Lt ER  Sidelying Lt  scapular mobilizations & distraction Supine cervical distraction, bil first rib mobilization, suboccipital release, C0 on C1 AP mobs 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-1 knee on table, ipsilat foot off table and on floor     PATIENT EDUCATION:  Education details:  Anatomy of condition, POC, HEP, exercise form/rationale, periodization, precautions and safety Person educated: Patient and Spouse Education method: Explanation, Demonstration, Tactile cues, Verbal cues, and Handouts Education comprehension: verbalized understanding, returned demonstration, verbal cues required, tactile cues required, and needs further education  HOME EXERCISE PROGRAM: [id number]   ASSESSMENT:  CLINICAL IMPRESSION: Pt returns to PT with 8/10 pain with movement but ROM is not limited today. Pt had reduction to 3/10 following manual and isometrics. Pt with good tolerance to isometrics for pain control. Given pt's recurrence of high levels of pain, still plan to perform UE session but pt would likely continue to need pain management measures movement forward as the pain that returns greatly limits cervical and back motion for things such as driving, occupation, and ADL. Plan for UE exercise and reassessment of cervical pain for modification of POC. Pt would benefit from continued skilled therapy in order to reach goals and maximize functional cervical and lumboelvic strength and ROM for prevention of further functional decline.    REHAB POTENTIAL: Good  CLINICAL DECISION MAKING: Stable/uncomplicated  EVALUATION COMPLEXITY: Low   GOALS: Goals reviewed with patient? Yes  SHORT TERM GOALS: Target date: 10/26  Able to demo proper resting posture with awareness of correction of forward head.  Baseline: Goal status: achieved 10/21    LONG TERM GOALS: Target date: POC date  Meet FOTO goal Baseline:  Goal status: MET  2.  AROM cervical rotation to 60 deg bil without discomfort Baseline:   met  3.  Able to participate in activities without limitation by spasm Baseline:  Goal status: ongoing  4.  Bil GHJ ER to 5/5 Baseline:  Goal status: MET  5. Pt will be able to demonstrate DL squat/deadlift with >/=84 lbs in order to demonstrate functional improvement exercise tolerance and exercise technique for transition to independent gym exercise with personal training.  NEW GOAL  6. Pt  will become independent with final HEP in order to demonstrate synthesis of PT education for exercise in gym setting.  NEW GOAL     PLAN:  PT FREQUENCY: 1-2x/week  PT DURATION: 12 wks (d/c in 6-8)   PLANNED INTERVENTIONS: Therapeutic exercises, Therapeutic activity, Neuromuscular re-education, Patient/Family education, Self Care, Joint mobilization, Stair training, Aquatic Therapy, Dry Needling, Electrical stimulation, Spinal mobilization, Cryotherapy, Moist heat, Taping, Ionotophoresis 4mg /ml Dexamethasone , Manual therapy, and Re-evaluation.  PLAN FOR NEXT SESSION: DN PRN, deep neck flexor & periscap strengthening.   Dale Call PT, DPT 03/11/23 4:13 PM

## 2023-03-16 LAB — GENECONNECT MOLECULAR SCREEN: Genetic Analysis Overall Interpretation: NEGATIVE

## 2023-04-08 ENCOUNTER — Ambulatory Visit (HOSPITAL_BASED_OUTPATIENT_CLINIC_OR_DEPARTMENT_OTHER): Payer: HMO | Attending: Neurological Surgery | Admitting: Physical Therapy

## 2023-04-08 ENCOUNTER — Encounter (HOSPITAL_BASED_OUTPATIENT_CLINIC_OR_DEPARTMENT_OTHER): Payer: Self-pay | Admitting: Physical Therapy

## 2023-04-08 DIAGNOSIS — M542 Cervicalgia: Secondary | ICD-10-CM | POA: Diagnosis not present

## 2023-04-08 DIAGNOSIS — M62830 Muscle spasm of back: Secondary | ICD-10-CM | POA: Diagnosis not present

## 2023-04-08 NOTE — Therapy (Signed)
 OUTPATIENT PHYSICAL THERAPY TREATMENT  Progress Note Reporting Period 01/19/23 to 04/08/23   See note below for Objective Data and Assessment of Progress/Goals.       Patient Name: Eric Cochran MRN: [id number] DOB:[birth year], 73 y.o., male Today's Date: 04/08/2023  END OF SESSION:  PT End of Session - 04/08/23 1028     Visit Number 27    Number of Visits 30    Date for PT Re-Evaluation 04/19/23    Authorization Type HTA    PT Start Time 0801    PT Stop Time 0843    PT Time Calculation (min) 42 min    Activity Tolerance Patient tolerated treatment well    Behavior During Therapy  [facility] for tasks assessed/performed                             Past Medical History:  Diagnosis Date   Arthritis    Obesity (BMI 30-39.9)    Obstructive sleep apnea    severe- on VPAP auto    Perirectal abscess    Past Surgical History:  Procedure Laterality Date   ANKLE SURGERY     BACK SURGERY     HERNIA REPAIR     umb hernia   INCISION AND DRAINAGE PERIRECTAL ABSCESS  04/08/2011   Procedure: IRRIGATION AND DEBRIDEMENT PERIRECTAL ABSCESS;  Surgeon: Clovis Pu. Cornett, MD;  Location: [facility] OR;  Service: General;  Laterality: N/A;   LIGAMENT REPAIR     right arm ulna   NASAL SINUS SURGERY     Patient Active Problem List   Diagnosis Date Noted   Obstructive sleep apnea    Obesity (BMI 30-39.9)      REFERRING PROVIDER:  Dawley, Troy C, DO    REFERRING DIAG: M54.2 (ICD-10-CM) - Cervicalgia   Dry Needling  Rationale for Evaluation and Treatment: Rehabilitation  THERAPY DIAG:  Cervicalgia  Muscle spasm of back  ONSET DATE: subacute on chronic   SUBJECTIVE:                                                                                                                                                                                           SUBJECTIVE STATEMENT: Pt states that the massages periodically are helping the pain but the R shoulder/ UT continues  to return and limit ADL. Pt feels that the pain has continued to improve but is having a hard time with turning his neck and reaching.   PERTINENT HISTORY:  L5 S1 fusion bony, L3-4 plate fusion  PAIN:  Are you having pain? Yes: NPRS scale: 8/10 Pain location: neck  Pain description: spasms, general discomfort Aggravating factors: looking up Relieving factors: ice  PRECAUTIONS:  None  RED FLAGS: None   WEIGHT BEARING RESTRICTIONS:  No  FALLS:  Has patient fallen in last 6 months? 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  DIAGNOSTIC FINDINGS:  No recent imaging available  PATIENT SURVEYS:  FOTO 50  11/11 FOTO 61  12/16  FOTO 66% (GOAL  MET)  ODI  Modified Oswestry Low Back Pain Disability Questionnaire: 20 / 50 = 40.0 %  3/5 ODI: Modified Oswestry Low Back Pain Disability Questionnaire: 15 / 50 = 30.0 %     POSTURE:  forward head, decreased lumbar lordosis, and decreased thoracic kyphosis  HAND DOMINANCE:  Right   Body Part #1 Cervical   CERVICAL ROM:   Active ROM A/PROM (deg) eval 11/13/22 10/14 11/20/22 11/4 11/11 12/16 3/5  Flexion 18 35  41  50 55 58  Extension 22 35  42  42 50 52  Right lateral flexion 14 20  24  improves to 25 26 27 30 30   Left lateral flexion 18 18  19  improves to 25 26 28 30 20  (R sided tightness, discomfort)   Right rotation 50 52 62 after manual 64 improved to 73 66 68 69 55 p!  Left rotation 52 55 62 after manual 70 improved to 73 68 69 68 68   (Blank rows = not tested)  MMT Right 3/5  Left 3/5  Shoulder flexion 5 5  Shoulder extension    Shoulder abduction 5 5  Shoulder adduction    Shoulder internal rotation 5 p! 5  Shoulder external rotation 5 p!  5  (Blank rows = not tested)  LUMBAR ROM:   Active   A/PROM  12/16  Flexion 50%  Extension 10% p!  Right lateral flexion 25%   Left lateral flexion 25%  Right rotation 25% p!  Left rotation 25% p!    (Blank rows = not tested)      TREATMENT:   3/5  Trigger Point Dry Needling  Subsequent Treatment: Instructions provided previously at initial dry needling treatment.   Patient Verbal Consent Given: Yes Muscles Treated: R UT Treatment Response: several large LTR with soft tissue extensibility increase  STM R UT  HEP review, volume and frequency changes, self pain management, exercise regressions   2/5   Trigger Point Dry Needling  Subsequent Treatment: Instructions provided previously at initial dry needling treatment.   Patient Verbal Consent Given: Yes Attempted to needle C/S paraspinals; hypertonicity bends needle- clinician not able to perform TPDN    CPA and UPA C3-6 grade III-IV STM bilat C/S paraspinals  Cervical isometrics 3s 10x each direction    1/27  Trigger Point Dry Needling  Subsequent Treatment: Instructions provided previously at initial dry needling treatment.   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   Patient Verbal Consent  Given: Yes Education Handout Provided: Previously Provided Muscles Treated: R C/S paraspinals Electrical Stimulation Performed: No Treatment Response/Outcome: LTR elicited, soft tissue extensibility increase    CPA and UPA C3-6 grade III-IV STM R C/S paraspinals  Chest supported row set up Machine chest press set up Lat pull  down set up Avoiding of OHP or axial loading type exercise   1/13  CPA and UPA C3-6 grade III-IV Grade III L and R SB mob STM L and R UT; R levator, R rhomboid  Self mobilization for C/S Self STM Massage therapy   1/6  Periscapular STM in shoulder IR position (underside of scap) T/S paraspinals Child's pose with lat stretch 5s 10x Lat stretch with doorway 30s 2x YTB band paloff 2x10  Self STM with ball  Lumbar ext at wall     PATIENT EDUCATION:  Education details:  Anatomy of condition, POC, HEP, exercise form/rationale, periodization, precautions and safety Person educated: Patient and Spouse Education method: Explanation, Demonstration, Tactile cues, Verbal cues, and Handouts Education comprehension: verbalized understanding, returned demonstration, verbal cues required, tactile cues required, and needs further education  HOME EXERCISE PROGRAM: [id number]   ASSESSMENT:  CLINICAL IMPRESSION: Patient demonstrates subjective and mild objective improvement since previous session and has improved pain management with the use of massage therapy.  Patient does have recurrent right upper trap pain that limits ADLs, driving, carrying, lifting, and household duties.  Likely due to to overuse related to have injury with compounding volume with occupation, household duties, and home exercise program.  Home exercise program volume and intensity modified in order to reduce irritation.  Patient did respond well to dry needling and had significant improvement in rotational range of motion and pain reduction following session.  Plan to see patient at significantly decreased frequency moving forward in order to prevent future pain and maintain current range of motion and functional cervical mobility and upper extremity range of motion.  Patient will see neurosurgeon shortly in order to discuss safe return to gym and contraindications for \ specific exercise.  Education provided today on on  gym-based exercise and avoidance of excessive motion that stresses fusion.  Pt would benefit from continued skilled therapy in order to reach goals and maximize functional cervical and lumboelvic strength and ROM for prevention of further functional decline.    REHAB POTENTIAL: Good  CLINICAL DECISION MAKING: Stable/uncomplicated  EVALUATION COMPLEXITY: Low   GOALS: Goals reviewed with patient? Yes  SHORT TERM GOALS: Target date: 10/26  Able to demo proper resting posture with awareness of correction of forward head.  Baseline: Goal status: achieved 10/21    LONG TERM GOALS: Target date: POC date  Meet FOTO goal Baseline:  Goal status: MET  2.  AROM cervical rotation to 60 deg bil without discomfort Baseline:  met  3.  Able to participate in activities without limitation by spasm Baseline:  Goal status: ongoing  4.  Bil GHJ ER to 5/5 Baseline:  Goal status: MET  5. Pt will be able to demonstrate DL squat/deadlift with >/=16 lbs in order to demonstrate functional improvement exercise tolerance and exercise technique for transition to independent gym exercise with personal training.  NEW GOAL  6. Pt  will become independent with final HEP in order to demonstrate synthesis of PT education for exercise in gym setting.  NEW GOAL     PLAN:  PT FREQUENCY: 1x/month  PT DURATION: 12 wks   PLANNED INTERVENTIONS: Therapeutic exercises, Therapeutic activity, Neuromuscular re-education, Patient/Family education, Self Care, Joint  mobilization, Stair training, Aquatic Therapy, Dry Needling, Electrical stimulation, Spinal mobilization, Cryotherapy, Moist heat, Taping, Ionotophoresis 4mg /ml Dexamethasone, Manual therapy, and Re-evaluation.  PLAN FOR NEXT SESSION: DN PRN, STM PRN, ROM maintenance and pain prevention; strength as able  Zebedee Iba PT, DPT 04/08/23 10:36 AM

## 2023-04-13 DIAGNOSIS — Z0001 Encounter for general adult medical examination with abnormal findings: Secondary | ICD-10-CM | POA: Diagnosis not present

## 2023-04-13 DIAGNOSIS — E7800 Pure hypercholesterolemia, unspecified: Secondary | ICD-10-CM | POA: Diagnosis not present

## 2023-04-13 DIAGNOSIS — R7301 Impaired fasting glucose: Secondary | ICD-10-CM | POA: Diagnosis not present

## 2023-04-13 DIAGNOSIS — Z79899 Other long term (current) drug therapy: Secondary | ICD-10-CM | POA: Diagnosis not present

## 2023-05-06 ENCOUNTER — Encounter (HOSPITAL_BASED_OUTPATIENT_CLINIC_OR_DEPARTMENT_OTHER): Payer: Self-pay | Admitting: Physical Therapy

## 2023-05-06 ENCOUNTER — Ambulatory Visit (HOSPITAL_BASED_OUTPATIENT_CLINIC_OR_DEPARTMENT_OTHER): Payer: HMO | Attending: Neurological Surgery | Admitting: Physical Therapy

## 2023-05-06 DIAGNOSIS — M62830 Muscle spasm of back: Secondary | ICD-10-CM | POA: Insufficient documentation

## 2023-05-06 DIAGNOSIS — M542 Cervicalgia: Secondary | ICD-10-CM | POA: Diagnosis not present

## 2023-05-06 NOTE — Therapy (Signed)
 OUTPATIENT PHYSICAL THERAPY TREATMENT  Progress Note Reporting Period 01/19/23 to 04/08/23   See note below for Objective Data and Assessment of Progress/Goals.       Patient Name: Eric Cochran MRN: [id number] DOB:[birth year], 73 y.o., male Today's Date: 05/06/2023  END OF SESSION:  PT End of Session - 05/06/23 0843     Visit Number 28    Number of Visits 30    Date for PT Re-Evaluation 07/07/23    Authorization Type HTA    PT Start Time 0800    PT Stop Time 0839    PT Time Calculation (min) 39 min    Activity Tolerance Patient tolerated treatment well    Behavior During Therapy [facility] for tasks assessed/performed                              Past Medical History:  Diagnosis Date   Arthritis    Obesity (BMI 30-39.9)    Obstructive sleep apnea    severe- on VPAP auto    Perirectal abscess    Past Surgical History:  Procedure Laterality Date   ANKLE SURGERY     BACK SURGERY     HERNIA REPAIR     umb hernia   INCISION AND DRAINAGE PERIRECTAL ABSCESS  04/08/2011   Procedure: IRRIGATION AND DEBRIDEMENT PERIRECTAL ABSCESS;  Surgeon: Clovis Pu. Cornett, MD;  Location: [facility] OR;  Service: General;  Laterality: N/A;   LIGAMENT REPAIR     right arm ulna   NASAL SINUS SURGERY     Patient Active Problem List   Diagnosis Date Noted   Obstructive sleep apnea    Obesity (BMI 30-39.9)      REFERRING PROVIDER:  Dawley, Troy C, DO    REFERRING DIAG: M54.2 (ICD-10-CM) - Cervicalgia   Dry Needling  Rationale for Evaluation and Treatment: Rehabilitation  THERAPY DIAG:  Cervicalgia  Muscle spasm of back  ONSET DATE: subacute on chronic   SUBJECTIVE:                                                                                                                                                                                           SUBJECTIVE STATEMENT: Pt states the neck is better with massage therapy and activity modification. Weather will  cause a 6/10 pain. Baseline pain is now 2/10 and isolated to the R UT. Reaching is still uncomfortable with yard work. He has had to reduce the weight of what he does with working. He is going to ease into the gym. Quick rotations while driving is still painful. Basleine movement is  now less uncomfortable  PERTINENT HISTORY:  L5 S1 fusion bony, L3-4 plate fusion  PAIN:  Are you having pain? Yes: NPRS scale: 2/10 Pain location: neck  Pain description: spasms, general discomfort Aggravating factors: looking up Relieving factors: ice  PRECAUTIONS:  None  RED FLAGS: None   WEIGHT BEARING RESTRICTIONS:  No  FALLS:  Has patient fallen in last 6 months? 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  DIAGNOSTIC FINDINGS:  No recent imaging available  PATIENT SURVEYS:  FOTO 50  11/11 FOTO 61  12/16  FOTO 66% (GOAL  MET)  ODI  Modified Oswestry Low Back Pain Disability Questionnaire: 20 / 50 = 40.0 %  3/5 ODI: Modified Oswestry Low Back Pain Disability Questionnaire: 15 / 50 = 30.0 %    POSTURE:  forward head, decreased lumbar lordosis, and decreased thoracic kyphosis  HAND DOMINANCE:  Right   Body Part #1 Cervical   CERVICAL ROM:   Active ROM A/PROM (deg) eval 11/13/22 10/14 11/20/22 11/4 11/11 12/16 3/5 4/2  Flexion 18 35  41  50 55 58 62  Extension 22 35  42  42 50 52 54  Right lateral flexion 14 20  24  improves to 25 26 27 30 30  33  Left lateral flexion 18 18  19  improves to 25 26 28 30 20  (R sided tightness, discomfort)  25  Right rotation 50 52 62 after manual 64 improved to 73 66 68 69 55 p! 52  Left rotation 52 55 62 after manual 70 improved to 73 68 69 68 68 65   (Blank rows = not tested)  MMT Right 3/5  Left 3/5  Shoulder flexion 5 5  Shoulder  extension    Shoulder abduction 5 5  Shoulder adduction    Shoulder internal rotation 5 p! 5  Shoulder external rotation 5 p!  5  (Blank rows = not tested)  LUMBAR ROM:   Active  A/PROM  12/16  Flexion 50%  Extension 10% p!  Right lateral flexion 25%   Left lateral flexion 25%  Right rotation 25% p!  Left rotation 25% p!    (Blank rows = not tested)      TREATMENT:   4/2  Trigger Point Dry Needling  Subsequent Treatment: Instructions provided previously at initial dry needling treatment.   Patient Verbal Consent Given: Yes Muscles Treated: R UT Treatment Response: several large LTR with soft tissue extensibility increase  STM R UT, pec with IR/ER  HEP review, volume and frequency changes, self pain management, exercise regressions  3/5  Trigger Point Dry Needling  Subsequent Treatment: Instructions provided previously at initial dry needling treatment.   Patient Verbal Consent Given: Yes Muscles Treated: R UT Treatment Response: several large LTR with soft tissue extensibility increase  STM R UT  HEP review, volume and frequency changes, self pain management, exercise regressions   2/5   Trigger Point Dry Needling  Subsequent Treatment: Instructions provided previously at initial dry needling treatment.   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   Patient Verbal Consent Given: Yes Education Handout Provided: Previously Provided Muscles Treated: R C/S paraspinals Electrical Stimulation Performed: No Treatment Response/Outcome: LTR elicited, soft tissue extensibility increase    CPA and UPA C3-6 grade III-IV STM R C/S paraspinals  Chest supported row set up Machine chest press set up Lat pull down set up Avoiding of OHP or axial loading type exercise   1/13  CPA and UPA C3-6 grade III-IV Grade III L and R SB mob STM L and R UT; R levator, R rhomboid  Self mobilization for C/S Self STM Massage therapy   1/6  Periscapular STM in shoulder IR position (underside of scap) T/S paraspinals Child's pose with lat stretch 5s 10x Lat stretch with doorway 30s 2x YTB band paloff 2x10  Self STM with ball  Lumbar ext at wall     PATIENT EDUCATION:  Education details:  Anatomy of condition, POC, HEP, exercise form/rationale, periodization, precautions and safety Person educated: Patient and Spouse Education method: Explanation, Demonstration, Tactile cues, Verbal cues, and Handouts Education comprehension: verbalized understanding, returned demonstration, verbal cues required, tactile cues required, and needs further education  HOME EXERCISE PROGRAM: [id number]   ASSESSMENT:  CLINICAL IMPRESSION: Pt with improvement in cervical ROM with less pain at baseline with AROM. Pt with reduction of TTP and with baseline reduction in hypertonicity. Pt given edu about work modifications to reduce pec stiffness and improve pushing posture at work. Plan to continue with preventative treatments here.  Pt would benefit from continued skilled therapy in order to reach goals and maximize functional cervical and lumboelvic strength and ROM  for prevention of further functional decline.    REHAB POTENTIAL: Good  CLINICAL DECISION MAKING: Stable/uncomplicated  EVALUATION COMPLEXITY: Low   GOALS: Goals reviewed with patient? Yes  SHORT TERM GOALS: Target date: 10/26  Able to demo proper resting posture with awareness of correction of forward head.  Baseline: Goal status: achieved 10/21    LONG TERM GOALS: Target date: POC date  Meet FOTO goal Baseline:  Goal status: MET  2.  AROM cervical rotation to 60 deg bil without discomfort Baseline:  met  3.  Able to participate in activities without limitation by spasm Baseline:  Goal status: ongoing  4.  Bil GHJ ER to 5/5 Baseline:  Goal status: MET  5. Pt will be able to demonstrate DL squat/deadlift with >/=16 lbs in order to demonstrate functional improvement exercise tolerance and exercise technique for transition to independent gym exercise with personal training.  NEW GOAL  6. Pt  will become independent with final HEP in order to demonstrate synthesis of PT education for exercise in gym setting.  NEW GOAL     PLAN:  PT FREQUENCY: 1x/month  PT DURATION: 12 wks   PLANNED INTERVENTIONS: Therapeutic exercises, Therapeutic activity, Neuromuscular re-education, Patient/Family education, Self Care, Joint mobilization, Stair training, Aquatic Therapy, Dry Needling, Electrical stimulation, Spinal mobilization, Cryotherapy, Moist heat, Taping, Ionotophoresis 4mg /ml Dexamethasone, Manual therapy, and Re-evaluation.  PLAN  FOR NEXT SESSION: DN PRN, STM PRN, ROM maintenance and pain prevention; strength as able  Zebedee Iba PT, DPT 05/06/23 8:48 AM

## 2023-05-07 DIAGNOSIS — Z09 Encounter for follow-up examination after completed treatment for conditions other than malignant neoplasm: Secondary | ICD-10-CM | POA: Diagnosis not present

## 2023-05-07 DIAGNOSIS — Z860101 Personal history of adenomatous and serrated colon polyps: Secondary | ICD-10-CM | POA: Diagnosis not present

## 2023-05-07 DIAGNOSIS — K5730 Diverticulosis of large intestine without perforation or abscess without bleeding: Secondary | ICD-10-CM | POA: Diagnosis not present

## 2023-06-08 DIAGNOSIS — G4730 Sleep apnea, unspecified: Secondary | ICD-10-CM | POA: Diagnosis not present

## 2023-06-08 DIAGNOSIS — G4733 Obstructive sleep apnea (adult) (pediatric): Secondary | ICD-10-CM | POA: Diagnosis not present

## 2023-06-10 ENCOUNTER — Encounter (HOSPITAL_BASED_OUTPATIENT_CLINIC_OR_DEPARTMENT_OTHER): Payer: HMO | Admitting: Physical Therapy

## 2023-07-08 ENCOUNTER — Encounter (HOSPITAL_BASED_OUTPATIENT_CLINIC_OR_DEPARTMENT_OTHER): Admitting: Physical Therapy

## 2023-07-10 ENCOUNTER — Other Ambulatory Visit (INDEPENDENT_AMBULATORY_CARE_PROVIDER_SITE_OTHER): Payer: Self-pay

## 2023-07-10 ENCOUNTER — Ambulatory Visit: Admitting: Orthopaedic Surgery

## 2023-07-10 ENCOUNTER — Encounter: Payer: Self-pay | Admitting: Orthopaedic Surgery

## 2023-07-10 DIAGNOSIS — M25561 Pain in right knee: Secondary | ICD-10-CM

## 2023-07-10 DIAGNOSIS — G8929 Other chronic pain: Secondary | ICD-10-CM | POA: Diagnosis not present

## 2023-07-10 MED ORDER — BUPIVACAINE HCL 0.5 % IJ SOLN
2.0000 mL | INTRAMUSCULAR | Status: AC | PRN
  Administered 2023-07-10: 2 mL via INTRA_ARTICULAR

## 2023-07-10 MED ORDER — LIDOCAINE HCL 1 % IJ SOLN
2.0000 mL | INTRAMUSCULAR | Status: AC | PRN
  Administered 2023-07-10: 2 mL

## 2023-07-10 MED ORDER — METHYLPREDNISOLONE ACETATE 40 MG/ML IJ SUSP
40.0000 mg | INTRAMUSCULAR | Status: AC | PRN
  Administered 2023-07-10: 40 mg via INTRA_ARTICULAR

## 2023-07-10 NOTE — Progress Notes (Signed)
 Office Visit Note   Patient: Eric Cochran           Date of Birth: 23-Dec-1950           MRN: [id number] Visit Date: 07/10/2023              Requested by: Lanae Pinal, MD [address] [place],  [place] [address] PCP: Lanae Pinal, MD   Assessment & Plan: Visit Diagnoses:  1. Chronic pain of right knee     Plan: History of Present Illness Eric Cochran is a 73 year old male who presents with right knee pain following an injury.  He experienced sharp pain on the medial aspect of the right knee after twisting his leg to avoid stepping on his puppy three weeks ago. The pain was initially severe, subsided by limiting activity, but recurred with normal activities. There is no locking or clicking, but occasional giving way occurs in the morning. 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 He has arthritis in his hands and has undergone spinal surgeries for bone spurs and narrow disc spaces. He limits activities to avoid aggravating the knee pain.  Physical Exam MUSCULOSKELETAL: No effusion in the right knee joint. Medial knee pain at 95 degrees flexion in the right knee. Pain just below medial joint line palpation in the right knee. No tenderness on lateral joint line palpation in the right knee. No pain with MCL stress test in the right knee.  Assessment and Plan Right knee pain Acute right knee pain post-twisting injury. Pain localized to medial knee. Differential: medial meniscus tear, MCL sprain, bone contusion. X-rays: no fracture, adequate joint space, mild spurring. Cortisone injection discussed for inflammation reduction. MRI considered if symptoms persist.  Will pay close attention to immediate relief if any during anesthetic phase. - Administer cortisone injection for symptomatic relief. - Advise use of compression knee sleeve during  activities involving prolonged standing or walking. - Reassess in 3-4 weeks to evaluate improvement. - Consider MRI if symptoms do not improve in 3-4 weeks.  Follow-Up Instructions: No follow-ups on file.   Orders:  Orders Placed This Encounter  Procedures   Large Joint Inj: R knee   XR KNEE 3 VIEW RIGHT   No orders of the defined types were placed in this encounter.  Exam of the right knee - no effusion, joint line tenderness or ligamentous laxity - normal motion with pain past 100 degrees - tenderness to the medial tibial plateau just below the medial joint line   Procedures: Large Joint Inj: R knee on 07/10/2023 10:59 AM Indications: pain Details: 22 G needle  Arthrogram: No  Medications: 40 mg methylPREDNISolone acetate 40 MG/ML; 2 mL lidocaine  1 %; 2 mL bupivacaine 0.5 % Consent was given by the patient. Patient was prepped and draped in the usual sterile fashion.     Subjective: Chief Complaint  Patient presents with   Right Knee - Pain   Review of Systems  Constitutional: Negative.   HENT: Negative.    Eyes: Negative.   Respiratory: Negative.    Cardiovascular: Negative.   Gastrointestinal: Negative.   Endocrine: Negative.   Genitourinary: Negative.   Skin: Negative.   Allergic/Immunologic: Negative.   Neurological: Negative.   Hematological: Negative.   Psychiatric/Behavioral: Negative.    All other systems reviewed and are negative.    Objective: Vital Signs: There were no  vitals taken for this visit.  Physical Exam Vitals and nursing note reviewed.  Constitutional:      Appearance: He is well-developed.  HENT:     Head: Normocephalic and atraumatic.  Eyes:     Pupils: Pupils are equal, round, and reactive to light.  Pulmonary:     Effort: Pulmonary effort is normal.  Abdominal:     Palpations: Abdomen is soft.  Musculoskeletal:        General: Normal range of motion.     Cervical back: Neck supple.  Skin:    General: Skin is warm.   Neurological:     Mental Status: He is alert and oriented to person, place, and time.  Psychiatric:        Behavior: Behavior normal.        Thought Content: Thought content normal.        Judgment: Judgment normal.     Imaging: XR KNEE 3 VIEW RIGHT Result Date: 07/10/2023 X-rays of the right knee show no acute or structural abnormalities    PMFS History: Patient Active Problem List   Diagnosis Date Noted   Obstructive sleep apnea    Obesity (BMI 30-39.9)    Past Medical History:  Diagnosis Date   Arthritis    Obesity (BMI 30-39.9)    Obstructive sleep apnea    severe- on VPAP auto    Perirectal abscess     Family History  Problem Relation Age of Onset   Cancer Mother        ovarian   Cancer Father        lung   Heart disease Father     Past Surgical History:  Procedure Laterality Date   ANKLE SURGERY     BACK SURGERY     HERNIA REPAIR     umb hernia   INCISION AND DRAINAGE PERIRECTAL ABSCESS  04/08/2011   Procedure: IRRIGATION AND DEBRIDEMENT PERIRECTAL ABSCESS;  Surgeon: Brandy Cal. Cornett, MD;  Location: [facility] OR;  Service: General;  Laterality: N/A;   LIGAMENT REPAIR     right arm ulna   NASAL SINUS SURGERY     Social History   Occupational History   Not on file  Tobacco Use   Smoking status: Never   Smokeless tobacco: Never  Substance and Sexual Activity   Alcohol use: No   Drug use: No   Sexual activity: Not on file

## 2023-07-20 DIAGNOSIS — R972 Elevated prostate specific antigen [PSA]: Secondary | ICD-10-CM | POA: Diagnosis not present

## 2023-08-04 DIAGNOSIS — M26609 Unspecified temporomandibular joint disorder, unspecified side: Secondary | ICD-10-CM | POA: Diagnosis not present

## 2023-08-04 DIAGNOSIS — R221 Localized swelling, mass and lump, neck: Secondary | ICD-10-CM | POA: Diagnosis not present

## 2023-08-06 ENCOUNTER — Other Ambulatory Visit: Payer: Self-pay | Admitting: Family Medicine

## 2023-08-06 DIAGNOSIS — R221 Localized swelling, mass and lump, neck: Secondary | ICD-10-CM

## 2023-08-11 ENCOUNTER — Ambulatory Visit: Admitting: Orthopaedic Surgery

## 2023-08-12 ENCOUNTER — Ambulatory Visit
Admission: RE | Admit: 2023-08-12 | Discharge: 2023-08-12 | Disposition: A | Discharge status: Home / Self Care | Source: Ambulatory Visit | Attending: Family Medicine | Admitting: Family Medicine

## 2023-08-12 DIAGNOSIS — R221 Localized swelling, mass and lump, neck: Secondary | ICD-10-CM

## 2023-08-12 DIAGNOSIS — R220 Localized swelling, mass and lump, head: Secondary | ICD-10-CM | POA: Diagnosis not present

## 2023-08-12 DIAGNOSIS — K119 Disease of salivary gland, unspecified: Secondary | ICD-10-CM | POA: Diagnosis not present

## 2023-08-18 DIAGNOSIS — D37030 Neoplasm of uncertain behavior of the parotid salivary glands: Secondary | ICD-10-CM | POA: Diagnosis not present

## 2023-08-19 ENCOUNTER — Other Ambulatory Visit (HOSPITAL_COMMUNITY): Payer: Self-pay | Admitting: Otolaryngology

## 2023-08-19 DIAGNOSIS — D37030 Neoplasm of uncertain behavior of the parotid salivary glands: Secondary | ICD-10-CM

## 2023-08-20 ENCOUNTER — Ambulatory Visit (HOSPITAL_COMMUNITY)
Admission: RE | Admit: 2023-08-20 | Discharge: 2023-08-20 | Disposition: A | Discharge status: Home / Self Care | Source: Ambulatory Visit | Attending: Otolaryngology | Admitting: Otolaryngology

## 2023-08-20 DIAGNOSIS — K118 Other diseases of salivary glands: Secondary | ICD-10-CM | POA: Diagnosis not present

## 2023-08-20 DIAGNOSIS — D37030 Neoplasm of uncertain behavior of the parotid salivary glands: Secondary | ICD-10-CM | POA: Insufficient documentation

## 2023-08-21 NOTE — Progress Notes (Unsigned)
 [name], MD  Eric Cochran OK for FNA of right parotid lesion in US . Local only.  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

## 2023-09-07 ENCOUNTER — Ambulatory Visit (HOSPITAL_COMMUNITY)
Admission: RE | Admit: 2023-09-07 | Discharge: 2023-09-07 | Disposition: A | Discharge status: Home / Self Care | Source: Ambulatory Visit | Attending: Otolaryngology

## 2023-09-07 DIAGNOSIS — D110 Benign neoplasm of parotid gland: Secondary | ICD-10-CM | POA: Diagnosis not present

## 2023-09-07 DIAGNOSIS — D37030 Neoplasm of uncertain behavior of the parotid salivary glands: Secondary | ICD-10-CM

## 2023-09-07 DIAGNOSIS — K118 Other diseases of salivary glands: Secondary | ICD-10-CM | POA: Diagnosis not present

## 2023-09-07 MED ORDER — LIDOCAINE HCL (PF) 1 % IJ SOLN
8.0000 mL | Freq: Once | INTRAMUSCULAR | Status: AC
  Administered 2023-09-07: 8 mL via INTRADERMAL

## 2023-09-07 NOTE — Procedures (Signed)
  Procedure:  US  FNA R parotid mass 18g x5 Preprocedure diagnosis: The encounter diagnosis was Neoplasm of uncertain behavior of parotid gland. Postprocedure diagnosis: same EBL:    minimal Complications:   none immediate  See full dictation in [employer].  CHARM Toribio Faes MD Main # [phone] Pager  [phone] Mobile [phone]

## 2023-09-09 LAB — CYTOLOGY - NON PAP

## 2023-09-11 DIAGNOSIS — D110 Benign neoplasm of parotid gland: Secondary | ICD-10-CM | POA: Diagnosis not present

## 2023-09-11 DIAGNOSIS — M26609 Unspecified temporomandibular joint disorder, unspecified side: Secondary | ICD-10-CM | POA: Diagnosis not present

## 2023-09-22 ENCOUNTER — Other Ambulatory Visit: Payer: Self-pay | Admitting: Otolaryngology

## 2023-10-09 ENCOUNTER — Institutional Professional Consult (permissible substitution) (INDEPENDENT_AMBULATORY_CARE_PROVIDER_SITE_OTHER): Admitting: Otolaryngology

## 2023-10-28 ENCOUNTER — Other Ambulatory Visit (HOSPITAL_BASED_OUTPATIENT_CLINIC_OR_DEPARTMENT_OTHER): Payer: Self-pay

## 2023-10-28 MED ORDER — COMIRNATY 30 MCG/0.3ML IM SUSY
0.3000 mL | PREFILLED_SYRINGE | Freq: Once | INTRAMUSCULAR | 0 refills | Status: AC
  Filled 2023-10-28: qty 0.3, 1d supply, fill #0

## 2023-11-01 IMAGING — MR MR PROSTATE WO/W CM
13 series · 48 of 48 positions shown · IV contrast (19 mL Multihance)
Comparison: None.

CLINICAL DATA: Elevated PSA.

EXAM:
MR PROSTATE WITHOUT AND WITH CONTRAST
TECHNIQUE: Multiplanar multisequence MRI images were obtained of the pelvis
centered about the prostate. Pre and post contrast images were
obtained.
CONTRAST:  19mL MULTIHANCE GADOBENATE DIMEGLUMINE 529 MG/ML IV SOLN

[Series 2: new loc · axial · 6.0mm · 0.88mm/px · 1 of 17 slices shown]
[im 1/17]
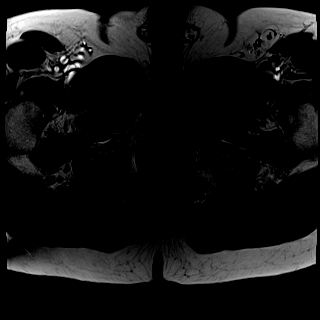

[Series 3: T2 · coronal · 3.0mm · 0.56mm/px · 1 of 34 slices shown (1 of 3)]
[im 1/34]
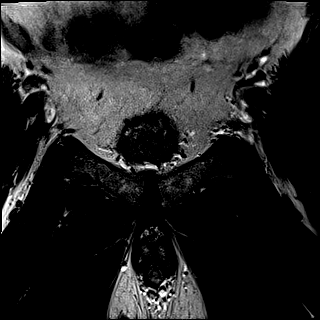

[Series 4: T1 · axial · 5.0mm · 1.25mm/px · 1 of 80 slices shown]
[im 1/80]
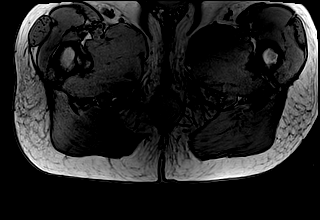

[Series 5: DWI · axial · 3.0mm · 1.75mm/px · 1 of 96 slices shown (1 of 3)]
[im 1/96]
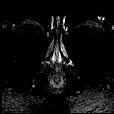

[Series 6: DWI · axial · 3.0mm · 1.75mm/px · 1 of 32 slices shown (2 of 3)]
[im 1/32]
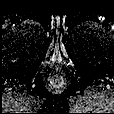

[Series 7: DWI · axial · 3.0mm · 1.75mm/px · 1 of 32 slices shown (3 of 3)]
[im 1/32]
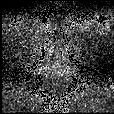

[Series 8: T2 · axial · 3.0mm · 0.56mm/px · 1 of 32 slices shown (2 of 3)]
[im 1/32]
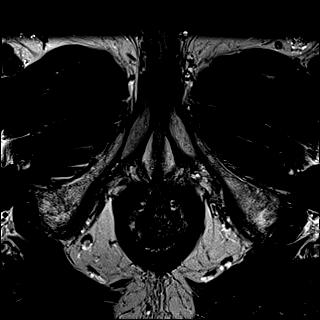

[Series 9: T2 · axial · 1.0mm · 1.04mm/px · z∈[-5,+82]mm · 2 of 88 slices shown (3 of 3)]
[im 1/88]
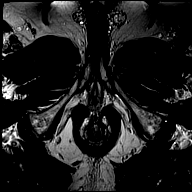
[im 88/88]
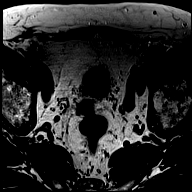

[Series 10: pre t1_twist_tra_dyn · axial · non-contrast · 3.5mm · 0.83mm/px · 1 of 24 slices shown]
[im 1/24]
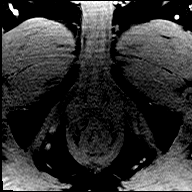

[Series 11: post t1_twist_tra_dyn-copy center · axial · non-contrast · 3.5mm · 0.83mm/px · z∈[-2,+78]mm · 17 of 720 slices shown]
[im 1/720]
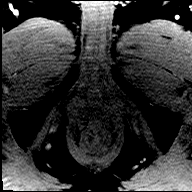
[im 45/720]
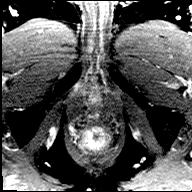
[im 90/720]
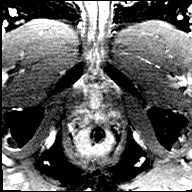
[im 135/720]
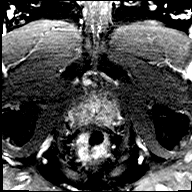
[im 180/720]
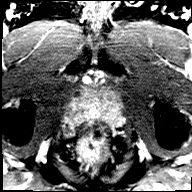
[im 225/720]
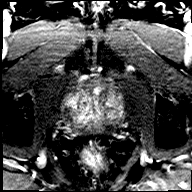
[im 270/720]
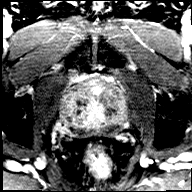
[im 315/720]
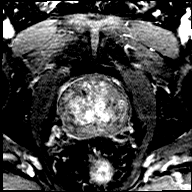
[im 360/720]
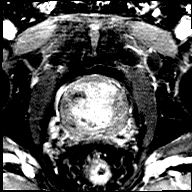
[im 405/720]
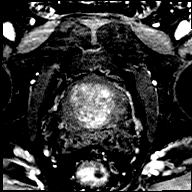
[im 450/720]
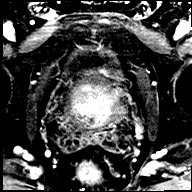
[im 495/720]
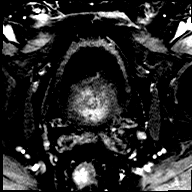
[im 540/720]
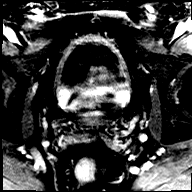
[im 585/720]
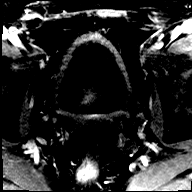
[im 630/720]
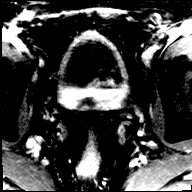
[im 675/720]
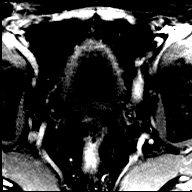
[im 720/720]
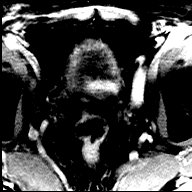

[Series 12: post t1_twist_tra_dyn-copy cent_sub · axial · 3.5mm · 0.83mm/px · z∈[-2,+78]mm · 17 of 694 slices shown]
[im 1/694]
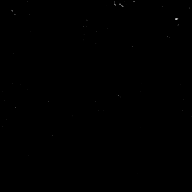
[im 44/694]
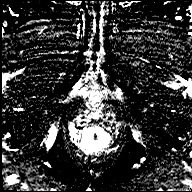
[im 87/694]
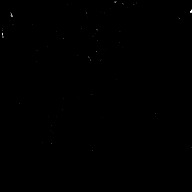
[im 130/694]
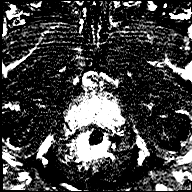
[im 174/694]
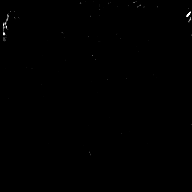
[im 217/694]
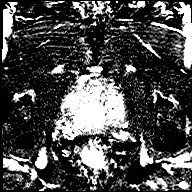
[im 260/694]
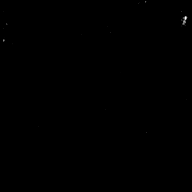
[im 304/694]
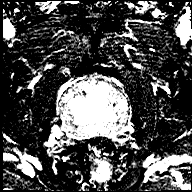
[im 347/694]
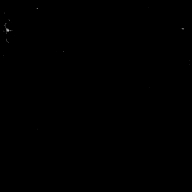
[im 390/694]
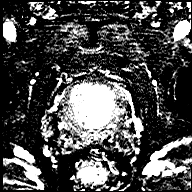
[im 434/694]
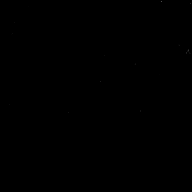
[im 477/694]
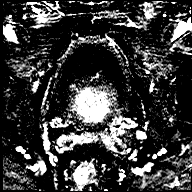
[im 520/694]
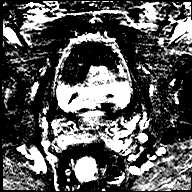
[im 564/694]
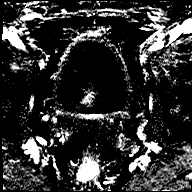
[im 607/694]
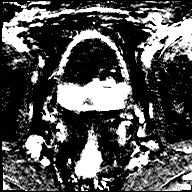
[im 650/694]
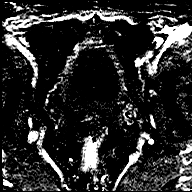
[im 694/694]
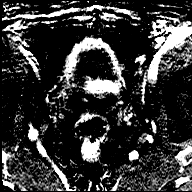

[Series 13: t1_vibe_dixon_tra_f · axial · 2.5mm · 0.91mm/px · z∈[-28,+170]mm · 2 of 80 slices shown]
[im 1/80]
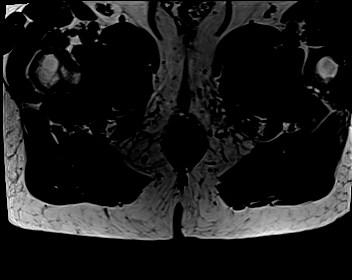
[im 80/80]
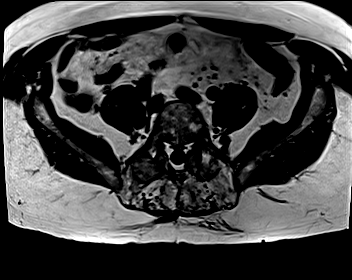

[Series 14: t1_vibe_dixon_tra_w · axial · 2.5mm · 0.91mm/px · z∈[-28,+170]mm · 2 of 80 slices shown]
[im 1/80]
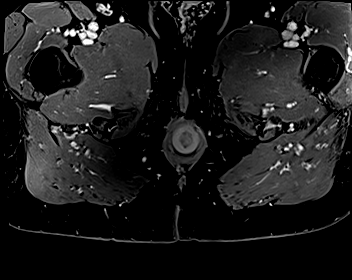
[im 80/80]
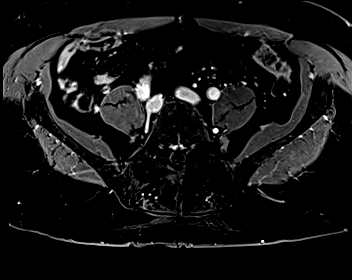

[48 of 48 positions shown; findings below may reference images not displayed]

FINDINGS: Prostate:

-- Peripheral Zone: No focal lesion seen on ADC or high b-value DWI
sequences.

-- Transition/Central Zone: Moderately enlarged with encapsulated
BPH nodules. No suspicious nodules identified on T2-weighted or
diffusion sequences.

-- Measurements/Volume:  6.2 by 5.2 by 5.9 cm (volume = 100 cm^3)

Transcapsular spread:  Absent

Seminal vesicle involvement:  Absent

Neurovascular bundle involvement:  Absent

Pelvic adenopathy: None visualized

Bone metastasis: None visualized

Other: Diffuse bladder wall thickening, consistent with chronic
bladder outlet obstruction. Sigmoid diverticulosis is noted, without
evidence of diverticulitis.
IMPRESSION: No radiographic evidence of high-grade prostate carcinoma. PI-RADS 1
(v2.1): Very Low (clinically significant cancer highly unlikely)

## 2023-11-09 ENCOUNTER — Other Ambulatory Visit (HOSPITAL_BASED_OUTPATIENT_CLINIC_OR_DEPARTMENT_OTHER): Payer: Self-pay

## 2023-11-09 MED ORDER — FLUZONE HIGH-DOSE 0.5 ML IM SUSY
0.5000 mL | PREFILLED_SYRINGE | Freq: Once | INTRAMUSCULAR | 0 refills | Status: AC
  Filled 2023-11-09: qty 0.5, 1d supply, fill #0

## 2023-11-18 DIAGNOSIS — H26491 Other secondary cataract, right eye: Secondary | ICD-10-CM | POA: Diagnosis not present

## 2023-11-18 DIAGNOSIS — H353132 Nonexudative age-related macular degeneration, bilateral, intermediate dry stage: Secondary | ICD-10-CM | POA: Diagnosis not present

## 2023-11-18 DIAGNOSIS — H18593 Other hereditary corneal dystrophies, bilateral: Secondary | ICD-10-CM | POA: Diagnosis not present

## 2023-11-18 DIAGNOSIS — H43813 Vitreous degeneration, bilateral: Secondary | ICD-10-CM | POA: Diagnosis not present

## 2023-11-24 NOTE — Progress Notes (Signed)
????????????????????????    Patient:?  Eric Cochran DOB:?  06-12-1950  Sex:   male EMRN:?  [id number]  Encounter Date:  11/24/2023     ?  ?  Orders  ?  FACILITY:??[facility]  ?  SURGEON:?? Elspeth Coddington, MD ?  SURGERY DATE:? 12/02/23  Wed  ???????????TIME: ????11:00 am??????????????????   ?  ANESTHESIA:?? General ?  STATUS:?? Observation ?  PROCEDURE:?? Rt Parotidectomy - 42415 ?  DIAGNOSIS:? 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  ?  LATEX ALLERGY: ?  ?  EQUIPMENT: ????????????  ?  INSURANCE:?  [employer] [place] # [id number]   SECONDARY:?????  ??  PRECERT#: ?

## 2023-11-25 NOTE — Pre-Procedure Instructions (Signed)
 Surgical Instructions   Your procedure is scheduled on December 02, 2023. Report to [facility] Main Entrance A at 9:00 A.M., then check in with the Admitting office. Any questions or running late day of surgery: call [phone]  Questions prior to your surgery date: call [phone], Monday-Friday, 8am-4pm. If you experience any cold or flu symptoms such as cough, fever, chills, shortness of breath, etc. between now and your scheduled surgery, please notify us  at the above number.     Remember:  Do not eat after midnight the night before your surgery   You may drink clear liquids until 8:00 AM the morning of your surgery.   Clear liquids allowed are: Water, Non-Citrus Juices (without pulp), Carbonated Beverages, Clear Tea (no milk, honey, etc.), Black Coffee Only (NO MILK, CREAM OR POWDERED CREAMER of any kind), and Gatorade.    Take these medicines the morning of surgery with A SIP OF WATER: NONE   May take these medicines IF NEEDED: carboxymethylcellulose (LUBRICANT EYE DROPS) drops fluticasone (FLONASE) nasal spray    One week prior to surgery, STOP taking any Aspirin (unless otherwise instructed by your surgeon) Aleve, Naproxen, Ibuprofen, Motrin, Advil, Goody's, BC's, all herbal medications, fish oil, and non-prescription vitamins.                     Do NOT Smoke (Tobacco/Vaping) for 24 hours prior to your procedure.  If you use a CPAP at night, you may bring your mask/headgear for your overnight stay.   You will be asked to remove any contacts, glasses, piercing's, hearing aid's, dentures/partials prior to surgery. Please bring cases for these items if needed.    Patients discharged the day of surgery will not be allowed to drive home, and someone needs to stay with them for 24 hours.  SURGICAL WAITING ROOM VISITATION Patients may have no more than 2 support people in the waiting area - these visitors may rotate.   Pre-op nurse will coordinate an appropriate time  for 1 ADULT support person, who may not rotate, to accompany patient in pre-op.  Children under the age of 58 must have an adult with them who is not the patient and must remain in the main waiting area with an adult.  If the patient needs to stay at the hospital during part of their recovery, the visitor guidelines for inpatient rooms apply.  Please refer to the [facility] website for the visitor guidelines for any additional information.   If you received a COVID test during your pre-op visit  it is requested that you wear a mask when out in public, stay away from anyone that may not be feeling well and notify your surgeon if you develop symptoms. If you have been in contact with anyone that has tested positive in the last 10 days please notify you surgeon.      Pre-operative CHG Bathing Instructions   You can play a key role in reducing the risk of infection after surgery. Your skin needs to be as free of germs as possible. You can reduce the number of germs on your skin by washing with CHG (chlorhexidine gluconate) soap before surgery. CHG is an antiseptic soap that kills germs and continues to kill germs even after washing.   DO NOT use if you have an allergy to chlorhexidine/CHG or antibacterial soaps. If your skin becomes reddened or irritated, stop using the CHG and notify one of our RNs at [phone].  TAKE A SHOWER THE NIGHT BEFORE SURGERY   Please keep in mind the following:  DO NOT shave, including legs and underarms, 48 hours prior to surgery.   You may shave your face before/day of surgery.  Place clean sheets on your bed the night before surgery Use a clean washcloth (not used since being washed) for shower. DO NOT sleep with pet's night before surgery.  CHG Shower Instructions:  Wash your face and private area with normal soap. If you choose to wash your hair, wash first with your normal shampoo.  After you use shampoo/soap, rinse your hair and body  thoroughly to remove shampoo/soap residue.  Turn the water OFF and apply half the bottle of CHG soap to a CLEAN washcloth.  Apply CHG soap ONLY FROM YOUR NECK DOWN TO YOUR TOES (washing for 3-5 minutes)  DO NOT use CHG soap on face, private areas, open wounds, or sores.  Pay special attention to the area where your surgery is being performed.  If you are having back surgery, having someone wash your back for you may be helpful. Wait 2 minutes after CHG soap is applied, then you may rinse off the CHG soap.  Pat dry with a clean towel  Put on clean pajamas    Additional instructions for the day of surgery: If you choose, you may shower the morning of surgery with an antibacterial soap.  DO NOT APPLY any lotions, deodorants, cologne, or perfumes.   Do not wear jewelry or makeup Do not wear nail polish, gel polish, artificial nails, or any other type of covering on natural nails (fingers and toes) Do not bring valuables to the hospital. [facility] is not responsible for valuables/personal belongings. Put on clean/comfortable clothes.  Please brush your teeth.  Ask your nurse before applying any prescription medications to the skin.

## 2023-11-26 ENCOUNTER — Encounter (HOSPITAL_COMMUNITY): Payer: Self-pay

## 2023-11-26 ENCOUNTER — Other Ambulatory Visit: Payer: Self-pay

## 2023-11-26 ENCOUNTER — Encounter (HOSPITAL_COMMUNITY)
Admission: RE | Admit: 2023-11-26 | Discharge: 2023-11-26 | Disposition: A | Discharge status: Home / Self Care | Source: Ambulatory Visit | Attending: Otolaryngology | Admitting: Otolaryngology

## 2023-11-26 VITALS — BP 114/72 | HR 63 | Temp 97.7°F | Resp 17 | Ht 68.0 in | Wt 196.8 lb

## 2023-11-26 DIAGNOSIS — Z01812 Encounter for preprocedural laboratory examination: Secondary | ICD-10-CM | POA: Insufficient documentation

## 2023-11-26 DIAGNOSIS — Z01818 Encounter for other preprocedural examination: Secondary | ICD-10-CM

## 2023-11-26 HISTORY — DX: Personal history of urinary calculi: Z87.442

## 2023-11-26 LAB — CBC
HCT: 43 % (ref 39.0–52.0)
Hemoglobin: 14.5 g/dL (ref 13.0–17.0)
MCH: 29.7 pg (ref 26.0–34.0)
MCHC: 33.7 g/dL (ref 30.0–36.0)
MCV: 87.9 fL (ref 80.0–100.0)
Platelets: 196 K/uL (ref 150–400)
RBC: 4.89 MIL/uL (ref 4.22–5.81)
RDW: 13.2 % (ref 11.5–15.5)
WBC: 5.8 K/uL (ref 4.0–10.5)
nRBC: 0 % (ref 0.0–0.2)

## 2023-11-26 NOTE — Progress Notes (Signed)
 PCP - Dibas Regino, MD Cardiologist - Dr. Wilbert Bihari - Last office visit 10/09/2022 - MD manages OSA  PPM/ICD - Denies Device Orders - n/a Rep Notified - n/a  Chest x-ray - n/a EKG - Denies Stress Test - Per pt, many years ago. Result normal ECHO - Denies Cardiac Cath - Denies  Sleep Study - +OSA. Pt wears BiPAP nightly, but not aware of pressure settings  No DM  Last dose of GLP1 agonist- n/a GLP1 instructions: n/a  Blood Thinner Instructions: n/a Aspirin Instructions: n/a  ERAS Protcol - Clear liquids until 0800 morning of surgery PRE-SURGERY Ensure or G2- n/a  COVID TEST- n/a   Anesthesia review: No.  Patient denies shortness of breath, fever, cough and chest pain at PAT appointment. Pt denies any respiratory illness/infection in the last two months.   All instructions explained to the patient, with a verbal understanding of the material. Patient agrees to go over the instructions while at home for a better understanding. Patient also instructed to self quarantine after being tested for COVID-19. The opportunity to ask questions was provided.

## 2023-12-01 NOTE — Progress Notes (Signed)
 Spoke with the pt, he will arrive tom at 0620. NPO post mn, he will stop clear liquids at 0520.

## 2023-12-02 ENCOUNTER — Ambulatory Visit (HOSPITAL_COMMUNITY)
Admission: RE | Admit: 2023-12-02 | Discharge: 2023-12-02 | Disposition: A | Discharge status: Home / Self Care | Source: Ambulatory Visit | Attending: Otolaryngology | Admitting: Otolaryngology

## 2023-12-02 ENCOUNTER — Ambulatory Visit (HOSPITAL_COMMUNITY): Payer: Self-pay | Admitting: Anesthesiology

## 2023-12-02 ENCOUNTER — Encounter (HOSPITAL_COMMUNITY): Payer: Self-pay | Admitting: Otolaryngology

## 2023-12-02 ENCOUNTER — Ambulatory Visit (HOSPITAL_BASED_OUTPATIENT_CLINIC_OR_DEPARTMENT_OTHER): Payer: Self-pay | Admitting: Anesthesiology

## 2023-12-02 ENCOUNTER — Other Ambulatory Visit: Payer: Self-pay

## 2023-12-02 ENCOUNTER — Encounter (HOSPITAL_COMMUNITY)
Admission: RE | Disposition: A | Discharge status: Home / Self Care | Payer: Self-pay | Source: Ambulatory Visit | Attending: Otolaryngology

## 2023-12-02 DIAGNOSIS — M1990 Unspecified osteoarthritis, unspecified site: Secondary | ICD-10-CM | POA: Diagnosis not present

## 2023-12-02 DIAGNOSIS — C07 Malignant neoplasm of parotid gland: Secondary | ICD-10-CM | POA: Diagnosis not present

## 2023-12-02 DIAGNOSIS — D110 Benign neoplasm of parotid gland: Secondary | ICD-10-CM | POA: Diagnosis not present

## 2023-12-02 DIAGNOSIS — G4733 Obstructive sleep apnea (adult) (pediatric): Secondary | ICD-10-CM | POA: Diagnosis not present

## 2023-12-02 HISTORY — PX: PAROTID GLAND TUMOR EXCISION: SHX5221

## 2023-12-02 LAB — TYPE AND SCREEN
ABO/RH(D): AB POS
Antibody Screen: NEGATIVE

## 2023-12-02 LAB — ABO/RH: ABO/RH(D): AB POS

## 2023-12-02 SURGERY — EXCISION, NEOPLASM, PAROTID GLAND
Anesthesia: General | Laterality: Right

## 2023-12-02 MED ORDER — HYDROCODONE-ACETAMINOPHEN 5-325 MG PO TABS: 1.0000 | ORAL_TABLET | Freq: Four times a day (QID) | ORAL | 0 refills | Status: AC | PRN

## 2023-12-02 MED ORDER — CEFAZOLIN SODIUM-DEXTROSE 2-4 GM/100ML-% IV SOLN
INTRAVENOUS | Status: AC
  Filled 2023-12-02: qty 100

## 2023-12-02 MED ORDER — LACTATED RINGERS IV SOLN: INTRAVENOUS | Status: DC

## 2023-12-02 MED ORDER — BACITRACIN ZINC 500 UNIT/GM EX OINT
TOPICAL_OINTMENT | CUTANEOUS | Status: AC
  Filled 2023-12-02: qty 28.35

## 2023-12-02 MED ORDER — LIDOCAINE-EPINEPHRINE 1 %-1:100000 IJ SOLN
INTRAMUSCULAR | Status: AC
  Filled 2023-12-02: qty 1

## 2023-12-02 MED ORDER — LACTATED RINGERS IV SOLN
INTRAVENOUS | Status: DC | PRN
Start: 2023-12-02 — End: 2023-12-02

## 2023-12-02 MED ORDER — CHLORHEXIDINE GLUCONATE 0.12 % MT SOLN: 15.0000 mL | Freq: Once | OROMUCOSAL | Status: AC

## 2023-12-02 MED ORDER — LIDOCAINE 2% (20 MG/ML) 5 ML SYRINGE
INTRAMUSCULAR | Status: AC
  Filled 2023-12-02: qty 5

## 2023-12-02 MED ORDER — MIDAZOLAM HCL 2 MG/2ML IJ SOLN
INTRAMUSCULAR | Status: AC
  Filled 2023-12-02: qty 2

## 2023-12-02 MED ORDER — MIDAZOLAM HCL 5 MG/5ML IJ SOLN
INTRAMUSCULAR | Status: DC | PRN
  Administered 2023-12-02: 1 mg via INTRAVENOUS

## 2023-12-02 MED ORDER — PHENYLEPHRINE 80 MCG/ML (10ML) SYRINGE FOR IV PUSH (FOR BLOOD PRESSURE SUPPORT)
PREFILLED_SYRINGE | INTRAVENOUS | Status: AC
  Filled 2023-12-02: qty 10

## 2023-12-02 MED ORDER — EPINEPHRINE PF 1 MG/ML IJ SOLN
INTRAMUSCULAR | Status: DC | PRN
  Administered 2023-12-02: 1 mL

## 2023-12-02 MED ORDER — LIDOCAINE 2% (20 MG/ML) 5 ML SYRINGE
INTRAMUSCULAR | Status: DC | PRN
  Administered 2023-12-02: 100 mg via INTRAVENOUS

## 2023-12-02 MED ORDER — OXYCODONE HCL 5 MG PO TABS
ORAL_TABLET | ORAL | Status: AC
  Filled 2023-12-02: qty 1

## 2023-12-02 MED ORDER — EPHEDRINE SULFATE-NACL 50-0.9 MG/10ML-% IV SOSY
PREFILLED_SYRINGE | INTRAVENOUS | Status: DC | PRN
  Administered 2023-12-02 (×2): 5 mg via INTRAVENOUS

## 2023-12-02 MED ORDER — PHENYLEPHRINE 80 MCG/ML (10ML) SYRINGE FOR IV PUSH (FOR BLOOD PRESSURE SUPPORT)
PREFILLED_SYRINGE | INTRAVENOUS | Status: DC | PRN
  Administered 2023-12-02 (×3): 80 ug via INTRAVENOUS

## 2023-12-02 MED ORDER — FENTANYL CITRATE (PF) 100 MCG/2ML IJ SOLN
25.0000 ug | INTRAMUSCULAR | Status: DC | PRN
  Administered 2023-12-02: 25 ug via INTRAVENOUS

## 2023-12-02 MED ORDER — FENTANYL CITRATE (PF) 250 MCG/5ML IJ SOLN
INTRAMUSCULAR | Status: DC | PRN
  Administered 2023-12-02: 100 ug via INTRAVENOUS
  Administered 2023-12-02: 50 ug via INTRAVENOUS

## 2023-12-02 MED ORDER — PROPOFOL 500 MG/50ML IV EMUL
INTRAVENOUS | Status: DC | PRN
  Administered 2023-12-02 (×2): 75 ug/kg/min via INTRAVENOUS

## 2023-12-02 MED ORDER — SUCCINYLCHOLINE CHLORIDE 200 MG/10ML IV SOSY
PREFILLED_SYRINGE | INTRAVENOUS | Status: DC | PRN
  Administered 2023-12-02: 120 mg via INTRAVENOUS

## 2023-12-02 MED ORDER — DEXAMETHASONE SOD PHOSPHATE PF 10 MG/ML IJ SOLN
INTRAMUSCULAR | Status: DC | PRN
  Administered 2023-12-02: 5 mg via INTRAVENOUS

## 2023-12-02 MED ORDER — PROPOFOL 10 MG/ML IV BOLUS
INTRAVENOUS | Status: AC
Start: 2023-12-02 — End: 2023-12-02
  Filled 2023-12-02: qty 20

## 2023-12-02 MED ORDER — HEMOSTATIC AGENTS (NO CHARGE) OPTIME
TOPICAL | Status: DC | PRN
  Administered 2023-12-02: 1 via TOPICAL

## 2023-12-02 MED ORDER — ONDANSETRON HCL 4 MG/2ML IJ SOLN: 4.0000 mg | Freq: Once | INTRAMUSCULAR | Status: DC | PRN

## 2023-12-02 MED ORDER — PROPOFOL 10 MG/ML IV BOLUS
INTRAVENOUS | Status: DC | PRN
Start: 2023-12-02 — End: 2023-12-02
  Administered 2023-12-02: 170 mg via INTRAVENOUS

## 2023-12-02 MED ORDER — EPINEPHRINE HCL (NASAL) 0.1 % NA SOLN
NASAL | Status: AC
  Filled 2023-12-02: qty 30

## 2023-12-02 MED ORDER — FENTANYL CITRATE (PF) 250 MCG/5ML IJ SOLN
INTRAMUSCULAR | Status: AC
  Filled 2023-12-02: qty 5

## 2023-12-02 MED ORDER — EPINEPHRINE PF 1 MG/ML IJ SOLN
INTRAMUSCULAR | Status: AC
  Filled 2023-12-02: qty 1

## 2023-12-02 MED ORDER — OXYCODONE HCL 5 MG PO TABS
5.0000 mg | ORAL_TABLET | Freq: Once | ORAL | Status: AC | PRN
  Administered 2023-12-02: 5 mg via ORAL

## 2023-12-02 MED ORDER — SODIUM CHLORIDE (PF) 0.9 % IJ SOLN
INTRAMUSCULAR | Status: DC | PRN
  Administered 2023-12-02: 100 mL

## 2023-12-02 MED ORDER — ACETAMINOPHEN 500 MG PO TABS: 1000.0000 mg | ORAL_TABLET | Freq: Once | ORAL | Status: AC

## 2023-12-02 MED ORDER — EPHEDRINE 5 MG/ML INJ
INTRAVENOUS | Status: AC
  Filled 2023-12-02: qty 5

## 2023-12-02 MED ORDER — CEFAZOLIN SODIUM-DEXTROSE 2-4 GM/100ML-% IV SOLN
2.0000 g | INTRAVENOUS | Status: AC
  Administered 2023-12-02: 2 g via INTRAVENOUS

## 2023-12-02 MED ORDER — PHENYLEPHRINE HCL-NACL 20-0.9 MG/250ML-% IV SOLN
INTRAVENOUS | Status: DC | PRN
  Administered 2023-12-02: 70 ug/min via INTRAVENOUS

## 2023-12-02 MED ORDER — ACETAMINOPHEN 500 MG PO TABS
ORAL_TABLET | ORAL | Status: AC
  Administered 2023-12-02: 1000 mg via ORAL
  Filled 2023-12-02: qty 2

## 2023-12-02 MED ORDER — PROPOFOL 1000 MG/100ML IV EMUL
INTRAVENOUS | Status: AC
Start: 2023-12-02 — End: 2023-12-02
  Filled 2023-12-02: qty 100

## 2023-12-02 MED ORDER — GLYCOPYRROLATE PF 0.2 MG/ML IJ SOSY
PREFILLED_SYRINGE | INTRAMUSCULAR | Status: DC | PRN
  Administered 2023-12-02 (×2): .1 mg via INTRAVENOUS

## 2023-12-02 MED ORDER — PROPOFOL 1000 MG/100ML IV EMUL
INTRAVENOUS | Status: AC
  Filled 2023-12-02: qty 100

## 2023-12-02 MED ORDER — FENTANYL CITRATE (PF) 100 MCG/2ML IJ SOLN
INTRAMUSCULAR | Status: AC
  Filled 2023-12-02: qty 2

## 2023-12-02 MED ORDER — ONDANSETRON HCL 4 MG/2ML IJ SOLN
INTRAMUSCULAR | Status: DC | PRN
  Administered 2023-12-02: 4 mg via INTRAVENOUS

## 2023-12-02 MED ORDER — SODIUM CHLORIDE 0.9 % IV SOLN
0.1500 ug/kg/min | INTRAVENOUS | Status: AC
  Administered 2023-12-02: .1 ug/kg/min via INTRAVENOUS
  Filled 2023-12-02: qty 2000

## 2023-12-02 MED ORDER — ONDANSETRON HCL 4 MG/2ML IJ SOLN
INTRAMUSCULAR | Status: AC
  Filled 2023-12-02: qty 2

## 2023-12-02 MED ORDER — SUCCINYLCHOLINE CHLORIDE 200 MG/10ML IV SOSY
PREFILLED_SYRINGE | INTRAVENOUS | Status: AC
  Filled 2023-12-02: qty 10

## 2023-12-02 MED ORDER — PROPOFOL 10 MG/ML IV BOLUS
INTRAVENOUS | Status: AC
  Filled 2023-12-02: qty 20

## 2023-12-02 MED ORDER — OXYCODONE HCL 5 MG/5ML PO SOLN: 5.0000 mg | Freq: Once | ORAL | Status: AC | PRN

## 2023-12-02 MED ORDER — ORAL CARE MOUTH RINSE: 15.0000 mL | Freq: Once | OROMUCOSAL | Status: AC

## 2023-12-02 MED ORDER — CHLORHEXIDINE GLUCONATE 0.12 % MT SOLN
OROMUCOSAL | Status: AC
  Administered 2023-12-02: 15 mL via OROMUCOSAL
  Filled 2023-12-02: qty 15

## 2023-12-02 SURGICAL SUPPLY — 65 items
BAG COUNTER SPONGE SURGICOUNT (BAG) ×2 IMPLANT
BLADE SURG 12 STRL SS (BLADE) IMPLANT
BLADE SURG 15 STRL LF DISP TIS (BLADE) ×2 IMPLANT
BNDG COHESIVE 3X5 TAN ST LF (GAUZE/BANDAGES/DRESSINGS) IMPLANT
BNDG GAUZE DERMACEA FLUFF 4 (GAUZE/BANDAGES/DRESSINGS) IMPLANT
CANISTER SUCTION 3000ML PPV (SUCTIONS) ×2 IMPLANT
CLEANER TIP ELECTROSURG 2X2 (MISCELLANEOUS) ×2 IMPLANT
CLIP TI MEDIUM 24 (CLIP) ×2 IMPLANT
CLIP TI WIDE RED SMALL 24 (CLIP) ×2 IMPLANT
CORD BIPOLAR FORCEPS 12FT (ELECTRODE) ×2 IMPLANT
COVER SURGICAL LIGHT HANDLE (MISCELLANEOUS) ×2 IMPLANT
DRAIN 1/8 RD END PERF LFSIL ST (DRAIN) IMPLANT
DRAIN CHANNEL 19F RND (DRAIN) IMPLANT
DRAPE INCISE 13X13 STRL (DRAPES) IMPLANT
DRAPE INCISE 23X17 STRL (DRAPES) ×2 IMPLANT
DRAPE UTILITY XL STRL (DRAPES) IMPLANT
DRSG TEGADERM 2-3/8X2-3/4 SM (GAUZE/BANDAGES/DRESSINGS) ×4 IMPLANT
DRSG TEGADERM 4X4.75 (GAUZE/BANDAGES/DRESSINGS) IMPLANT
ELECT COATED BLADE 2.86 ST (ELECTRODE) ×2 IMPLANT
ELECTRODE PAIRED SUBDERMAL (MISCELLANEOUS) ×2 IMPLANT
ELECTRODE REM PT RTRN 9FT ADLT (ELECTROSURGICAL) ×2 IMPLANT
EVACUATOR SILICONE 100CC (DRAIN) IMPLANT
FORCEPS BIPOLAR SPETZLER 8 1.0 (NEUROSURGERY SUPPLIES) IMPLANT
GAUZE 4X4 16PLY ~~LOC~~+RFID DBL (SPONGE) IMPLANT
GAUZE SPONGE 4X4 12PLY STRL (GAUZE/BANDAGES/DRESSINGS) IMPLANT
GLOVE BIO SURGEON STRL SZ7.5 (GLOVE) ×2 IMPLANT
GLOVE BIOGEL PI IND STRL 8 (GLOVE) ×2 IMPLANT
GOWN STRL REUS W/ TWL LRG LVL3 (GOWN DISPOSABLE) ×2 IMPLANT
GOWN STRL REUS W/ TWL XL LVL3 (GOWN DISPOSABLE) ×2 IMPLANT
HEMOSTAT SURGICEL 2X14 (HEMOSTASIS) IMPLANT
HOOK RETRACT STAY BLUNT 12 (MISCELLANEOUS) IMPLANT
KIT BASIN OR (CUSTOM PROCEDURE TRAY) ×2 IMPLANT
KIT TURNOVER KIT B (KITS) ×2 IMPLANT
LOCATOR NERVE 3 VOLT (DISPOSABLE) IMPLANT
NDL FILTER BLUNT 18X1 1/2 (NEEDLE) ×2 IMPLANT
NDL PRECISIONGLIDE 27X1.5 (NEEDLE) ×2 IMPLANT
NEEDLE FILTER BLUNT 18X1 1/2 (NEEDLE) ×1 IMPLANT
NEEDLE PRECISIONGLIDE 27X1.5 (NEEDLE) ×1 IMPLANT
PAD ARMBOARD POSITIONER FOAM (MISCELLANEOUS) ×4 IMPLANT
PAD MAGNETIC INSTR ST 16X20 (MISCELLANEOUS) IMPLANT
PATTIES SURGICAL .5 X3 (DISPOSABLE) IMPLANT
PENCIL SMOKE EVACUATOR (MISCELLANEOUS) ×2 IMPLANT
POSITIONER HEAD DONUT 9IN (MISCELLANEOUS) ×2 IMPLANT
PROBE NERVBE PRASS .33 (MISCELLANEOUS) ×2 IMPLANT
RETRACTOR STAY HOOK 5MM (MISCELLANEOUS) ×2 IMPLANT
SET WALTER ACTIVATION W/DRAPE (SET/KITS/TRAYS/PACK) IMPLANT
SOLN 0.9% NACL POUR BTL 1000ML (IV SOLUTION) ×2 IMPLANT
SOLN STERILE WATER BTL 1000 ML (IV SOLUTION) ×2 IMPLANT
SPONGE INTESTINAL PEANUT (DISPOSABLE) ×2 IMPLANT
SPONGE T-LAP 18X18 ~~LOC~~+RFID (SPONGE) ×2 IMPLANT
STAPLER SKIN PROX 35W (STAPLE) ×2 IMPLANT
SUT ETHILON 3 0 PS 1 (SUTURE) IMPLANT
SUT ETHILON 5 0 PS 2 18 (SUTURE) ×2 IMPLANT
SUT SILK 2 0 SH CR/8 (SUTURE) ×2 IMPLANT
SUT SILK 3 0 REEL (SUTURE) ×2 IMPLANT
SUT SILK 3 0 SH 30 (SUTURE) IMPLANT
SUT SILK 3 0 SH CR/8 (SUTURE) ×2 IMPLANT
SUT SILK 4 0 REEL (SUTURE) IMPLANT
SUT VIC AB 3-0 SH 8-18 (SUTURE) ×2 IMPLANT
SUT VIC AB 4-0 PS2 18 (SUTURE) ×4 IMPLANT
SUT VIC AB 4-0 SH 18 (SUTURE) IMPLANT
SYR TB 1ML LUER SLIP (SYRINGE) ×2 IMPLANT
TOWEL GREEN STERILE FF (TOWEL DISPOSABLE) ×2 IMPLANT
TRAY ENT MC OR (CUSTOM PROCEDURE TRAY) ×2 IMPLANT
TRAY FOLEY MTR SLVR 14FR STAT (SET/KITS/TRAYS/PACK) IMPLANT

## 2023-12-02 NOTE — H&P (Signed)
 Eric Cochran is an 73 y.o. male.    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  He/she denies any interval change in history since office visit on 09/11/23.   Past Medical History:  Diagnosis Date   Arthritis    History of kidney stones    Obesity (BMI 30-39.9)    Obstructive sleep apnea    severe- on VPAP auto    Perirectal abscess     Past Surgical History:  Procedure Laterality Date   ANKLE SURGERY Right    Achilles Tendon Rupture   BUNIONECTOMY Left    CATARACT EXTRACTION Left    CATARACT EXTRACTION W/ INTRAOCULAR LENS IMPLANT Right    x3   COLONOSCOPY     INCISION AND DRAINAGE PERIRECTAL ABSCESS  04/08/2011   Procedure: IRRIGATION AND DEBRIDEMENT PERIRECTAL ABSCESS;  Surgeon: Debby LABOR. Cornett, MD;  Location: [facility] OR;  Service: General;  Laterality: N/A;   INGUINAL HERNIA REPAIR Right    LIGAMENT REPAIR     right arm ulna   LUMBAR FUSION     x2   NASAL SINUS SURGERY     multiple   UMBILICAL HERNIA REPAIR      Family History  Problem Relation Age of Onset   Cancer Mother        ovarian   Cancer Father        lung   Heart disease Father     Social History:  reports that he has never smoked. He has never used smokeless tobacco. He reports that he does not drink alcohol and does not use drugs.  Allergies:  Allergies  Allergen Reactions   Lactose Intolerance (Gi)     Medications Prior to Admission  Medication Sig Dispense Refill   carboxymethylcellulose (LUBRICANT EYE DROPS) 0.5 % SOLN Place 1-2 drops into both eyes 3 (three) times daily as needed (dry/irritated eyes.).     melatonin 3 MG TABS tablet Take 3 mg by mouth at bedtime as needed (sleep).     metroNIDAZOLE (METROGEL) 1 % gel Apply 1 application  topically daily as needed (rosacea).     Multiple Vitamins-Minerals (PRESERVISION AREDS 2 PO) Take 1 tablet by mouth in the morning and at bedtime.     naproxen sodium  (ALEVE) 220 MG tablet Take 220 mg by mouth 2 (two) times daily as needed (back pain.).     fluticasone (FLONASE) 50 MCG/ACT nasal spray Place 1 spray into both nostrils daily as needed for allergies or rhinitis.      No results found for this or any previous visit (from the past 48 hours). No results found.  ROS: negative other than stated in HPI  Blood pressure 124/80, pulse 64, temperature 97.8 F (36.6 C), temperature source Oral, resp. rate 17, height 5' 8 (1.727 m), weight 88.9 kg, SpO2 98%.  PHYSICAL EXAM: General: Resting comfortably in NAD  Lungs: Non-labored respiratinos  Studies Reviewed:  CT NECK without contrast  IMPRESSION: 1. 2.2 x 2.5 x 1.2 cm prominence along the anterior margin of the superficial lobe of the right parotid gland. This tissue shows slightly increased density compared to the other parotid tissue. Although accessory parotid tissue can occur, it should be the same density as the rest of the parotid gland and therefore this is worrisome for a mass lesion, presumably arising from the anterior aspect of the parotid gland. Broad surface along the masseter muscle but without evidence of detectable  muscular invasion. Intact subcutaneous fat plane between the nodule in the skin. This could be a benign or malignant neoplasm. 2. No lymphadenopathy. 3. Previous functional endoscopic sinus surgery. Retention cysts and/or polyps possibly present within the sinuses and nasal passages. 4. Cervical spondylosis most prominent at C5-6.     Electronically Signed   By: Oneil Officer M.D.   On: 08/13/2023 13:55     Assessment/Plan Right parotid gland pleomorphic adenoma    Informed consent obtained. R/B/A discussed including risks of pain, bleeding, infection, scarring, numbness, Frey's syndrome, first bite syndrome, recurrent tumor, facial nerve weakness/paralysis, ear numbness, hematoma/seroma/abscess, need for further surgery, risks of anesthesia. Despite  these risks the patient requested to proceed with surgery.  Plan for DC from PACU with drain.     Electronically signed by:  Elspeth Coddington, MD  Facial Plastic & Reconstructive Surgery Otolaryngology - Head and Neck Surgery [facility] - [facility]  12/02/2023, 7:51 AM

## 2023-12-02 NOTE — Discharge Instructions (Signed)
Nadean Montanaro G. Lakeithia Rasor MD  HOME CARE INSTRUCTIONS FOLLOWING PAROTID GLAND SURGERY:  DIET:  - Patients who have received general anesthesia may experience nausea and occasionally, vomiting. It is therefore preferable to eat a bland light meal or a liquid diet on the first day after the surgery. Regular diet may be resumed the next day. Also, pain pills may cause nausea if taken on an empty stomach. It is preferable to take those pills with a piece of toast or some food. It is important to maintain hydration by drinking lots of fluids after the procedure.   ACTIVITY AND WOUND CARE:  - Elevate the head as much as possible. Sit in a recliner or use two or three pillows when sleeping in bed. Head elevation reduces bruising and swelling. Occasionally, you may notice that the bruises or swelling have migrated to other places (usually lower regions). You may have a dressing or your wound may be exposed.   - If your wound is not covered with a dressing keep the exposed wound dry. Avoid showers for the first 48 hours. After this you may shower, but avoid shower water directly hitting the surgical wound. If you do get the wound wet, pat dry the area immediately after your shower. If you have been instructed to use a topical antibiotic and have not received a prescription for antibiotic ointment, use over-the-counter triple antibiotic such as Neosporin. Apply a scanty amount on the suture line. At times, you may not see the sutures because they have been placed inside the wound.  MEDICATIONS:  - It is important to take all medications as prescribed by your surgeon. An antibiotic may be prescribed following the surgery. The patient also receives a prescription for narcotic pain killers. These products cause somnolence, drowsiness and constipation. Patients who take painkillers should not operate machinery, drive or make important decisions. - Do not use aspirin for 2 weeks; it increases the possibility of  bleeding.  WHAT TO EXPECT:  - For the first one to two weeks after surgery, there will be a mild to moderate amount of pain in the neck, side of the face, jaw, ear, or throat. This can be controlled with prescription pain medicine received at hospital discharge or Tylenol (acetaminophen). Ice may be applied to the affected area to help reduce pain and swelling. - You may have numbness, tingling and pain around the surgical site. This may last a long time but will become less noticeable with time.  - There may be discomfort during eating, and swallowing hard, crunchy foods may cause increased discomfort in the involved region. In time, this discomfort will improve. - Swelling in the surgical site is normal and will peak in 2-3 days. Call our office if the incision swells excessively, becomes increasingly red and or starts to drain. - If you experience dry eyes on the operative side, it is ok to use natural lubricating tears to keep the eye well moistened.   RESTRICTIONS:  - Bed rest and very light activity is the rule for the first 24 hours postoperatively. You may increase your activity level as necessary, but use common sense. - Refrain from vigorous activity for the 10 days after surgery. Do not lift objects weighing over 8-10 pounds (roughly the weight of a gallon of milk) for a minimum of 2 weeks following surgery.  - After surgery, you may shower below the neck. Avoid showering above the neck until at least 48 hours after surgery. We strongly discourage soaking the incision in   the bathtub, swimming pool, or hot tub until you have discussed this with your physician. - Driving is prohibited while taking prescription pain medicine. Additionally patients should not drive until for a minimum of 2 weeks (or as instructed by your physician) following surgery of the head and neck region. Surgery in this area may cause decreased range of motion making driving unsafe. Do not drive if you are unable to  look behind your shoulder comfortably.  WHAT ARE SOME REASONS TO CONTACT YOUR DOCTOR AFTER SURGERY?  - After parotid surgery there is a small risk for temporary or even permanent facial paralysis on the operative side. If you notice any increase in facial asymmetry after discharge, particularly with closing your eyes fully, or with an abnormal droop of the side of your mouth, please contact our office. - Some swelling around the incision is normal. However, if is any sudden significant increase in neck swelling, apply an ice pack to the neck and call our office. If this occurs after regular business hours, please call  [phone] and ask for the on call ENT resident. - If you develop and shortness of breath or difficulty, breathing please present to the nearest emergency department emergently for evaluation. - Many people will experience low-grade fevers after surgery that may persist for one to two days. Low grade fevers (less than 101 F) usually will resolve with Tylenol and fluids. If you have a high fever (greater than 101 F) that lasts longer than 24 hours without any improvement, please notify our service. - At any time during the postop period, please call the office if you have any questions or concerns about excessive bleeding, breathing difficulty, pain, persistent fever, nausea, swelling or other concerns that seem out of the ordinary from what you have discussed with your surgeon or read in this handout.  [facility] - [place] [address] , [place] [address]  Phone: [phone]    

## 2023-12-02 NOTE — Op Note (Addendum)
 OPERATIVE NOTE  Eric Cochran Date/Time of Admission: 12/02/2023  6:18 AM  CSN: [id number];MRN:[id number] Attending Provider: Luciano Standing, MD Room/Bed: MCPO/NONE DOB: 02/28/1950 Age: 73 y.o.   Pre-Op Diagnosis: Pleomorphic adenoma of parotid gland  Post-Op Diagnosis: Pleomorphic adenoma of parotid gland  Procedure: Right superficial parotidectomy with facial nerve dissection (CPT 42415)  Anesthesia: General  Surgeon(s): Cochran Eric Luciano, MD  Staff: Circulator: Eric Verla SAILOR, RN Relief Circulator: Eric Moats, RN; de Eric Oneil KANDICE, RN Relief Scrub: Eric Fairy HERO, RN Scrub Person: Eric Cochran LABOR, CST RN First Assistant: Eric Keven CROME, RN  Implants: * No implants in log *  Specimens: ID Type Source Tests Collected by Time Destination  1 : right parotid Tissue PATH ENT excision SURGICAL PATHOLOGY Eric Standing, MD 12/02/2023 1211     Complications: none  EBL: 30 ML  IVF: Per anesthesia ML  Condition: stable  Operative Findings:  Anterior mid parotid tumor (pre-op FNA pleomorphic adenoma) infiltrating masseter and a buccal branch concerning for carcinoma - a single buccal branch was sacrificed with tumor to avoid gross disruption of capsule. At end of surgery main trunk stimulated and moved all branches at .8mA  Normal facial nerve exam in PACU  Description of Operation:  Once operative consent was obtained and the site and surgery were confirmed with the patient and the operating room team, the patient was brought back to the operating room and general endotracheal anesthesia was obtained. He was rotated to 180 degrees. The patient was turned over to the ENT service. The NIM facial nerve monitor was attached to the frontalis, orbicularis oculi and orbicularis oris, and mentalis (4 channel) it was noted to be in good working condition.    A modified Blair incision was planned on the right hand side and infiltrated with plain 1:100,000  epinephrine . The patient was prepped and draped in sterile fashion. Modified Blair incision was then made with a 15 blade. Superiorly, this incision was taken down in the subperichondrial plane, taken to the tragal cartilage until the tragal pointer was identified. Inferiorly, in continuing the dissection, the external jugular vein was identified as well as the greater auricular nerve. The greater auricular nerve branch to the auricle was preserved while branches into the parotid gland were ligated with surgical clips. The EJV was ligated with 3-0 silk.   A subplatysmal flap was elevated anteriorly and posteriorly in the inferior portion of the incision, and a mid SMAS flap was elevated with 15 blade and metzenbaum scissors anteriorly approximately 2 cm anterior to the location of the mass (in the region of the masseter muscle) This dissection was continued until it met with the superior dissection and that flap was elevated as well.  The anterior border of the sternocleidomastoid muscle was identified and followed until the posterior border of digastric muscle was identified. The posterior digastric muscle was followed to its attachment to the mastoid bone. With superior and inferior dissection plane clearly delineated, which estimated the depth and location of the facial nerve, the facial nerve was identified at the exit from the stylomastoid foramen and traced to the upper and lower division. The mass was excised from the superficial parotid lobe with care taken to dissect free each branch of the facial nerve with careful dissection from superior to inferior. The frontal and zygomatic branches were totally liberated. 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 There was also gross invasion of tumor into masseter muscle; a cuff was removed from  the deep margin with the tumor. I sacrificed the redundant buccal branch for oncologic control given concern for malignant degeneration. There was a secondary buccal branch that stimulated normally. The second the mass was freed from the facial nerve and excised with a margin of normal parotid tissue. T   Copious irrigation was placed into the wound and hemostasis was obtained. The main trunk of the facial nerve was tested with the nerve monitor and intact at all 4 channels. A size 19 french suction drain was placed into the incision posteriorly in the right neck and secured with 3-0 silk. The wound was closed with deep interrupted 3-0 Vicryl and 4-0 vicryl platysmal and deep dermal sutures and a combination of running 5-0 nylon and interrupted 6-0 and nylon suture for the skin. Bacitracin was applied to the wound. The drain was placed on bulb suction. The patient was then turned over to the anesthesiologist. He was extubated in the operating room and sent to the PACU in stable condition. The patient will be discharged after recovery in PACU. Normal facial nerve exam in PACU.   Eric Eric Coddington, MD [facility]  12/02/2023

## 2023-12-02 NOTE — Anesthesia Procedure Notes (Signed)
 Procedure Name: Intubation Date/Time: 12/02/2023 8:54 AM  Performed by: Claudene Arlin LABOR, CRNAPre-anesthesia Checklist: Patient identified, Emergency Drugs available, Suction available and Patient being monitored Patient Re-evaluated:Patient Re-evaluated prior to induction Oxygen Delivery Method: Circle system utilized Preoxygenation: Pre-oxygenation with 100% oxygen Induction Type: IV induction Ventilation: Mask ventilation without difficulty Laryngoscope Size: Miller and 2 Grade View: Grade II Tube type: Oral Tube size: 7.5 mm Number of attempts: 1 Airway Equipment and Method: Stylet Placement Confirmation: ETT inserted through vocal cords under direct vision, positive ETCO2 and breath sounds checked- equal and bilateral Secured at: 24 cm Tube secured with: Tape Dental Injury: Teeth and Oropharynx as per pre-operative assessment

## 2023-12-02 NOTE — Anesthesia Preprocedure Evaluation (Addendum)
 Anesthesia Evaluation  Patient identified by MRN, date of birth, ID band Patient awake    Reviewed: Allergy & Precautions, NPO status , Patient's Chart, lab work & pertinent test results  Airway Mallampati: II  TM Distance: >3 FB Neck ROM: Full    Dental  (+) Teeth Intact, Dental Advisory Given, Caps   Pulmonary sleep apnea and Continuous Positive Airway Pressure Ventilation    Pulmonary exam normal breath sounds clear to auscultation       Cardiovascular negative cardio ROS Normal cardiovascular exam Rhythm:Regular Rate:Normal     Neuro/Psych negative neurological ROS     GI/Hepatic negative GI ROS, Neg liver ROS,,,  Endo/Other  negative endocrine ROS    Renal/GU negative Renal ROS     Musculoskeletal  (+) Arthritis ,    Abdominal   Peds  Hematology negative hematology ROS (+)   Anesthesia Other Findings Day of surgery medications reviewed with the patient.  Pleomorphic adenoma of parotid gland  Reproductive/Obstetrics                              Anesthesia Physical Anesthesia Plan  ASA: 2  Anesthesia Plan: General   Post-op Pain Management: Tylenol  PO (pre-op)*   Induction: Intravenous  PONV Risk Score and Plan: 3 and Midazolam , Dexamethasone and Ondansetron   Airway Management Planned: Oral ETT  Additional Equipment:   Intra-op Plan:   Post-operative Plan: Extubation in OR  Informed Consent: I have reviewed the patients History and Physical, chart, labs and discussed the procedure including the risks, benefits and alternatives for the proposed anesthesia with the patient or authorized representative who has indicated his/her understanding and acceptance.     Dental advisory given  Plan Discussed with: CRNA  Anesthesia Plan Comments: (2nd PIV after induction)         Anesthesia Quick Evaluation

## 2023-12-02 NOTE — Transfer of Care (Signed)
 Immediate Anesthesia Transfer of Care Note  Patient: Eric Cochran  Procedure(s) Performed: EXCISION, NEOPLASM, PAROTID GLAND (Right)  Patient Location: PACU  Anesthesia Type:General  Level of Consciousness: drowsy  Airway & Oxygen Therapy: Patient Spontanous Breathing and Patient connected to face mask oxygen  Post-op Assessment: Report given to RN and Post -op Vital signs reviewed and stable  Post vital signs: Reviewed and stable  Last Vitals:  Vitals Value Taken Time  BP 133/88 12/02/23 13:15  Temp    Pulse 83 12/02/23 13:17  Resp 14 12/02/23 13:17  SpO2 93 % 12/02/23 13:17  Vitals shown include unfiled device data.  Last Pain:  Vitals:   12/02/23 0707  TempSrc:   PainSc: 0-No pain         Complications: No notable events documented.

## 2023-12-02 NOTE — Anesthesia Postprocedure Evaluation (Signed)
 Anesthesia Post Note  Patient: Eric Cochran  Procedure(s) Performed: EXCISION, NEOPLASM, PAROTID GLAND (Right)     Patient location during evaluation: PACU Anesthesia Type: General Level of consciousness: awake and alert Pain management: pain level controlled Vital Signs Assessment: post-procedure vital signs reviewed and stable Respiratory status: spontaneous breathing, nonlabored ventilation and respiratory function stable Cardiovascular status: blood pressure returned to baseline and stable Postop Assessment: no apparent nausea or vomiting Anesthetic complications: no   No notable events documented.  Last Vitals:  Vitals:   12/02/23 1400 12/02/23 1415  BP: 121/83 123/77  Pulse: 86 83  Resp: 15 12  Temp:  36.6 C  SpO2: 95% 93%    Last Pain:  Vitals:   12/02/23 1415  TempSrc:   PainSc: 4                  Garnette FORBES Skillern

## 2023-12-03 ENCOUNTER — Encounter (HOSPITAL_COMMUNITY): Payer: Self-pay | Admitting: Otolaryngology

## 2023-12-07 ENCOUNTER — Encounter: Payer: Self-pay | Admitting: Radiology

## 2023-12-09 NOTE — Progress Notes (Signed)
 ENT POST-OPERATIVE NOTE  ID: 73 year old male POD#7 (12/02/23) s/p right parotidectomy for basal cell adenoma   S: Overall doing well. Still having moderate pain when eating. Takes ibuprofen 1-2 times daily.   O: Facial incision c/d/i. Nylons removed. Facial nerve function normal. 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 PAROTID GLAND, RIGHT, PAROTIDECTOMY: - Basal cell adenoma, solid and membranous types. - Margins negative for adenoma.    A/P: POD#7 s/p right parotidectomy with pathology demonstrating basal cell adenoma. Discussed rareity of this benign tumor. Literature suggests surgical resection is considered curative and no additional treatment ie. Radiation is indicated.  1. H/O parotidectomy   2. Basal cell adenoma      Patient requesting patholgoy slides reviewed at [facility] for second opinion. I called and left a voicemail with [facility] requesting sendout. Will look for verification.   F/u in 6 weeks  Electronically signed by:  Elspeth Coddington, MD  Facial Plastic & Reconstructive Surgery Otolaryngology - Head and Neck Surgery [facility] [place]

## 2024-01-04 ENCOUNTER — Other Ambulatory Visit: Payer: Self-pay

## 2024-01-05 ENCOUNTER — Encounter: Payer: Self-pay | Admitting: Genetic Counselor

## 2024-01-06 NOTE — Telephone Encounter (Signed)
 Surgical pathology update. Called and left pt a voicemail. 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    Component 1 mo ago  Case Report Surgical Pathology Report                         Case: [id number]                                 Authorizing Provider:  Luciano Elspeth Hacker, MD Collected:           12/02/2023                 Ordering Location:     [facility] Surgical Pathology and Received:            12/14/2023 1434                                    Cytopathology                                                               Pathologist:           Arminda Cassis, MD                                                               Specimen:    Salivary Gland, Parotid, Right, [phone]                                            DIAGNOSIS   A.  Outside consult, [phone], [facility], [place], [place].  Date of procedure 12/02/2023:    Parotid gland, right, parotidectomy:    Basal cell adenocarcinoma, tumor size 2.9 cm per report. See diagnostic comment.  Electronically signed by Arminda Cassis, MD on 12/18/2023 at 1321 EST Preliminary result electronically signed by Arminda Cassis, MD on 12/16/2023 at [phone] EST  Diagnostic Comment   This basaloid neoplasm demonstrates infiltrative growth, and is morphologically and immunophenotypically consistent with basal cell adenocarcinoma. No high-grade transformation is seen. In the planes of section examined, the tumor is focally present at the inked edge. Dr. Edsel Range also reviewed the case and agrees with the diagnosis.   Clinical Information   [facility] review of outside slides is requested.   Gross Examination   Outside consult A:              [phone] Date of surgery:              12/02/23 Number of stained slides:             3 Number of blocks:                        1 very light-pink blk A2; [facility] label A-4 (Dr. Arminda) Number of unstained slides:  0   Outside path report received?    Yes Material to be returned?             Yes    Received from:    [place] Pathology Department [address] [place], [place]  [address] Tel: [phone] Fax: [phone]  Microscopic Examination   Microscopic examination is performed. Sections demonstrate a cellular proliferation composed of nested, trabecular or solid arrangement of lesional cells with infiltrative growth into adjacent fibroconnective tissue and major salivary gland tissue. There is no well-developed tumor capsule. The nests are separated by variably hyalinized basement membrane-like material. The lesional cells exhibit a biphasic appearance with small lesional cells predominantly at the periphery and more centrally located large polygonal cells with abundant eosinophilic cytoplasm. Mitotic figures are identifiable, but not brisk. Small foci of tumor necrosis are present. Scattered entrapped nerve fascicles are present within the lesion. Definitive perineural invasion is not seen. No lymphovascular invasion is identified.   Immunohistochemistry   The following immunohistochemistry was performed after review of the clinical history and morphology to further characterize the pathologic process. The results are as follows:   A-5        CK7                   Highlights luminal component A-6        SOX10               Positive in lesional cells, more pronounced in large cell population A-7        S100                  Positive in a subset of lesional cells. A-8        p63                    Positive in abluminal cell population. A-9        SMA                   Positive in lesional cells, more pronounced in peripheral small cell population A1-8Beta-cateninPredominantly cytoplasmic staining with rare nuclear staining A1-9      CD117               Positive in luminal cells.  Disclaimer   All immunohistochemistry, in situ hybridization tests and special stains performed at [facility] and reported herein were developed, validated and their performance  characteristics determined by the [facility]. During the performance of these tests, appropriate positive and negative control slides are also performed and reviewed. All control slides and internal controls (when applicable) demonstrate the expected immunoreactive patterns and/or nucleic acid hybridization. These ancillary studies were deemed medically necessary by the requesting pathologist. They were ordered following review of the H&E and clinical history except when part of a liver/kidney protocol or where clinical history (e.g. immunocompromised, critically ill, history of malignancy) clearly indicates. Some of the tests may not be cleared or approved by the U.S. Food and Drug Administration (FDA).  The FDA has determined that such clearance or approval is not necessary.  These tests are used for clinical purposes and should not be regarded as investigational or as research.  This laboratory is certified under the Clinical Laboratory Improvement Amendments of 1988 (CLIA) as qualified to perform high complexity clinical testing.  Attestation   All of the diagnostic evaluations on the enumerated specimens have been personally conducted by the pathologists  involved in the care of this patient as indicated by the electronic signatures above.

## 2024-01-07 ENCOUNTER — Other Ambulatory Visit: Payer: Self-pay

## 2024-01-07 DIAGNOSIS — C07 Malignant neoplasm of parotid gland: Secondary | ICD-10-CM

## 2024-01-07 NOTE — Progress Notes (Signed)
 Oncology Nurse Navigator Documentation   Placed introductory call to new referral patient Eric Cochran. Introduced myself as the H&N oncology nurse navigator that works with Dr. Izell to whom he has been referred by Dr. Luciano. He confirmed understanding of referral. Briefly explained my role as his navigator, provided my contact information.  I explained that I will schedule him for a CT of his chest to be completed prior to his consult with Dr. Izell.  I explained the purpose of a dental evaluation prior to starting RT, indicated he would be contacted by [facility] to arrange an appt.   I encouraged him to call with questions/concerns as he moves forward with appts and procedures.   He verbalized understanding of information provided, expressed appreciation for my call.   Delon Jefferson RN, BSN, OCN Head & Neck Oncology Nurse Navigator [facility] at [facility] Phone # [phone]  Fax # [phone]

## 2024-01-08 ENCOUNTER — Telehealth: Payer: Self-pay | Admitting: Radiation Oncology

## 2024-01-08 LAB — SURGICAL PATHOLOGY

## 2024-01-08 NOTE — Telephone Encounter (Signed)
 LVM for pt ro schedule consult with Dr. Izell

## 2024-01-12 NOTE — Progress Notes (Signed)
 Head and Neck Cancer Location of Tumor / Histology:  Basal Cell Adenocarcinoma of Salivary Gland  Patient presented  months ago with symptoms of:  Muscle spasm and found a lump on the side of his throat.  Biopsies revealed:       Nutrition Status Yes No Comments  Weight changes? []  [x]  Weight has stayed stable  Swallowing concerns? []  [x]    PEG? []  [x]     Referrals Yes No Comments  Social Work? [x]  []    Dentistry? [x]  []    Swallowing therapy? [x]  []    Nutrition? [x]  []    Med/Onc? [x]  []     Safety Issues Yes No Comments  Prior radiation? []  [x]    Pacemaker/ICD? []  [x]    Possible current pregnancy? []  [x]    Is the patient on methotrexate? []  [x]     Tobacco/Marijuana/Snuff/ETOH use: None  Past/Anticipated interventions by otolaryngology, if any:  12/02/2023 [place], MD Right Superficial Parotidectomy with Facial Nerve Dissection  Past/Anticipated interventions by medical oncology, if any: None     Current Complaints / other details:  None

## 2024-01-14 ENCOUNTER — Ambulatory Visit (HOSPITAL_COMMUNITY)
Admission: RE | Admit: 2024-01-14 | Discharge: 2024-01-14 | Attending: Radiation Oncology | Admitting: Radiation Oncology

## 2024-01-14 ENCOUNTER — Encounter (HOSPITAL_COMMUNITY): Payer: Self-pay

## 2024-01-14 DIAGNOSIS — C07 Malignant neoplasm of parotid gland: Secondary | ICD-10-CM | POA: Diagnosis present

## 2024-01-14 DIAGNOSIS — R972 Elevated prostate specific antigen [PSA]: Secondary | ICD-10-CM | POA: Diagnosis not present

## 2024-01-14 MED ORDER — IOHEXOL 300 MG/ML  SOLN
75.0000 mL | Freq: Once | INTRAMUSCULAR | Status: AC | PRN
  Administered 2024-01-14: 75 mL via INTRAVENOUS

## 2024-01-19 ENCOUNTER — Encounter: Payer: Self-pay | Admitting: Radiation Oncology

## 2024-01-19 ENCOUNTER — Ambulatory Visit
Admission: RE | Admit: 2024-01-19 | Discharge: 2024-01-19 | Attending: Radiation Oncology | Admitting: Radiation Oncology

## 2024-01-19 ENCOUNTER — Ambulatory Visit: Admission: RE | Admit: 2024-01-19 | Discharge: 2024-01-19 | Attending: Radiation Oncology

## 2024-01-19 VITALS — BP 149/84 | HR 74 | Temp 97.9°F | Resp 18 | Ht 68.0 in | Wt 195.0 lb

## 2024-01-19 DIAGNOSIS — G4733 Obstructive sleep apnea (adult) (pediatric): Secondary | ICD-10-CM | POA: Diagnosis not present

## 2024-01-19 DIAGNOSIS — C07 Malignant neoplasm of parotid gland: Secondary | ICD-10-CM | POA: Diagnosis present

## 2024-01-19 DIAGNOSIS — Z8041 Family history of malignant neoplasm of ovary: Secondary | ICD-10-CM | POA: Diagnosis not present

## 2024-01-19 DIAGNOSIS — E669 Obesity, unspecified: Secondary | ICD-10-CM | POA: Diagnosis not present

## 2024-01-19 DIAGNOSIS — Z87442 Personal history of urinary calculi: Secondary | ICD-10-CM | POA: Diagnosis not present

## 2024-01-19 DIAGNOSIS — Z85828 Personal history of other malignant neoplasm of skin: Secondary | ICD-10-CM | POA: Diagnosis not present

## 2024-01-19 DIAGNOSIS — Z79899 Other long term (current) drug therapy: Secondary | ICD-10-CM | POA: Diagnosis not present

## 2024-01-19 DIAGNOSIS — Z801 Family history of malignant neoplasm of trachea, bronchus and lung: Secondary | ICD-10-CM | POA: Diagnosis not present

## 2024-01-19 DIAGNOSIS — M479 Spondylosis, unspecified: Secondary | ICD-10-CM | POA: Diagnosis not present

## 2024-01-19 DIAGNOSIS — M1990 Unspecified osteoarthritis, unspecified site: Secondary | ICD-10-CM | POA: Diagnosis not present

## 2024-01-19 NOTE — Progress Notes (Signed)
 Oncology Nurse Navigator Documentation   Met with patient during initial consult with Dr. Izell. He was accompanied by his wife, [name].  Further introduced myself as his/their Navigator, explained my role as a member of the Care Team. Assisted with post-consult appt scheduling. I have contacted Dr. Nila office at [facility] for referral to see [name] to discuss radiation side effects and make scatter protective devices. They verbalized understanding of information provided. I encouraged them to call with questions/concerns moving forward.  Eric Jefferson, RN, BSN, OCN Head & Neck Oncology Nurse Navigator [facility] at [place] [phone]

## 2024-01-19 NOTE — Progress Notes (Signed)
 Radiation Oncology         [phone] ________________________________  Initial outpatient Consultation  Name: Eric Cochran MRN: [id number]  Date: 01/19/2024  DOB: Feb 03, 1951  RR:Xnpmjoj, Dibas, MD  Luciano Standing, MD   REFERRING PHYSICIAN: Luciano Standing, MD  DIAGNOSIS: No diagnosis found.   Cancer Staging  No matching staging information was found for the patient.   CHIEF COMPLAINT: Here to discuss management of salivary gland cancer  HISTORY OF PRESENT ILLNESS::Eric Cochran is a 73 y.o. male who presented to his PCP with complains of a facial lump that had persisted for the prior month. To further investigate his symptoms, he then underwent a CT neck on 08/12/23 showing a 2.2 x 2.5 x 1.2 cm nterior parotid neoplasm on top of the masseter muscle on the very anterior aspect of the gland. No visible lymphadenopathy. Findings somewhat limited by noncontrast enhanced study.     Subsequently, the patient saw Dr. Luciano on 08/18/23 to discuss further treatment plan. Upon discussion, they opted to proceed with ultrasound-guided biopsy  to access lesion. If the mass is malignant, they will proceed with a parotidectomy.   Subsequently, he underwent a parotid biopsy on 09/07/23 which revealed: pleomorphic adenoma. In light of findings, he then underwent a parotid gland neoplasm excision on 12/02/23 under the care of Dr. Luciano. Surgical pathology indicated tumor size of 2.9 cm and histology of basel cell adenocarcinoma with tumor focally present at inked margin.   Per Dr Luciano it was a sticky tumor - adherent to masseter   Pertinent imaging thus far includes CT chest performed on 01/14/24 revealing no evidence of metastatic disease .    Swallowing issues, if any: ***  Weight Changes: ***  Pain status: ***  Other symptoms: cramps in or under the lump , increasing TMJ symptoms, facial/jaw tension   Tobacco history, if any: none   ETOH abuse, if any: none  Prior cancers, if  any: history of basal cell carcinoma on his scalp, for which he underwent two Mohs surgeries for.  PREVIOUS RADIATION THERAPY: No  PAST MEDICAL HISTORY:  has a past medical history of Arthritis, History of kidney stones, Obesity (BMI 30-39.9), Obstructive sleep apnea, and Perirectal abscess.    PAST SURGICAL HISTORY: Past Surgical History:  Procedure Laterality Date   ANKLE SURGERY Right    Achilles Tendon Rupture   BUNIONECTOMY Left    CATARACT EXTRACTION Left    CATARACT EXTRACTION W/ INTRAOCULAR LENS IMPLANT Right    x3   COLONOSCOPY     INCISION AND DRAINAGE PERIRECTAL ABSCESS  04/08/2011   Procedure: IRRIGATION AND DEBRIDEMENT PERIRECTAL ABSCESS;  Surgeon: Debby LABOR. Cornett, MD;  Location: [facility] OR;  Service: General;  Laterality: N/A;   INGUINAL HERNIA REPAIR Right    LIGAMENT REPAIR     right arm ulna   LUMBAR FUSION     x2   NASAL SINUS SURGERY     multiple   PAROTID GLAND TUMOR EXCISION Right 12/02/2023   Procedure: EXCISION, NEOPLASM, PAROTID GLAND;  Surgeon: Luciano Standing, MD;  Location: [facility] OR;  Service: ENT;  Laterality: Right;  RIGHT PAROTIDECTOMY   UMBILICAL HERNIA REPAIR      FAMILY HISTORY: family history includes Heart disease in his father; Lung cancer in his father; Ovarian cancer in his mother.  SOCIAL HISTORY:  reports that he has never smoked. He has never used smokeless tobacco. He reports that he does not drink alcohol and does not use drugs.  ALLERGIES: Lactose intolerance (gi)  MEDICATIONS:  Current Outpatient Medications  Medication Sig Dispense Refill   carboxymethylcellulose (LUBRICANT EYE DROPS) 0.5 % SOLN Place 1-2 drops into both eyes 3 (three) times daily as needed (dry/irritated eyes.).     fluticasone (FLONASE) 50 MCG/ACT nasal spray Place 1 spray into both nostrils daily as needed for allergies or rhinitis.     melatonin 3 MG TABS tablet Take 3 mg by mouth at bedtime as needed (sleep).     metroNIDAZOLE (METROGEL) 1 % gel Apply 1 application   topically daily as needed (rosacea).     Multiple Vitamins-Minerals (PRESERVISION AREDS 2 PO) Take 1 tablet by mouth in the morning and at bedtime.     naproxen sodium (ALEVE) 220 MG tablet Take 220 mg by mouth 2 (two) times daily as needed (back pain.).     No current facility-administered medications for this visit.    REVIEW OF SYSTEMS:  Notable for that above.   PHYSICAL EXAM:  vitals were not taken for this visit.   General: Alert and oriented, in no acute distress HEENT: Head is normocephalic. Extraocular movements are intact. Oropharynx is notable for ***. Neck: Neck is notable for *** Heart: Regular in rate and rhythm with no murmurs, rubs, or gallops. Chest: Clear to auscultation bilaterally, with no rhonchi, wheezes, or rales. Abdomen: Soft, nontender, nondistended, with no rigidity or guarding. Extremities: No cyanosis or edema. Lymphatics: see Neck Exam Skin: No concerning lesions. Musculoskeletal: symmetric strength and muscle tone throughout. Neurologic: Cranial nerves II through XII are grossly intact. No obvious focalities. Speech is fluent. Coordination is intact. Psychiatric: Judgment and insight are intact. Affect is appropriate.   ECOG = ***  0 - Asymptomatic (Fully active, able to carry on all predisease activities without restriction)  1 - Symptomatic but completely ambulatory (Restricted in physically strenuous activity but ambulatory and able to carry out work of a light or sedentary nature. For example, light housework, office work)  2 - Symptomatic, <50% in bed during the day (Ambulatory and capable of all self care but unable to carry out any work activities. Up and about more than 50% of waking hours)  3 - Symptomatic, >50% in bed, but not bedbound (Capable of only limited self-care, confined to bed or chair 50% or more of waking hours)  4 - Bedbound (Completely disabled. Cannot carry on any self-care. Totally confined to bed or chair)  5 - Death    Raylene MM, Creech RH, Tormey DC, et al. [phone]). Toxicity and response criteria of the [facility]. Am. DOROTHA Bridges. Oncol. 5 (6): 649-55   LABORATORY DATA:  Lab Results  Component Value Date   WBC 5.8 11/26/2023   HGB 14.5 11/26/2023   HCT 43.0 11/26/2023   MCV 87.9 11/26/2023   PLT 196 11/26/2023   CMP     Component Value Date/Time   NA 136 04/09/2011 0525   K 3.3 (L) 04/09/2011 0525   CL 102 04/09/2011 0525   CO2 28 04/09/2011 0525   GLUCOSE 122 (H) 04/09/2011 0525   BUN 12 04/09/2011 0525   CREATININE 1.06 04/09/2011 0525   CALCIUM 8.5 04/09/2011 0525   PROT 7.2 04/08/2011 1736   ALBUMIN 3.0 (L) 04/08/2011 1736   AST 28 04/08/2011 1736   ALT 43 04/08/2011 1736   ALKPHOS 155 (H) 04/08/2011 1736   BILITOT 0.6 04/08/2011 1736   GFRNONAA 74 (L) 04/09/2011 0525   GFRAA 86 (L) 04/09/2011 0525      No results found for: TSH  RADIOGRAPHY: CT Chest W Contrast Result Date: 01/18/2024 CLINICAL DATA:  Head/neck cancer. Parotid gland cancer. * Tracking Code: BO * EXAM: CT CHEST WITH CONTRAST TECHNIQUE: Multidetector CT imaging of the chest was performed during intravenous contrast administration. RADIATION DOSE REDUCTION: This exam was performed according to the departmental dose-optimization program which includes automated exposure control, adjustment of the mA and/or kV according to patient size and/or use of iterative reconstruction technique. CONTRAST:  75mL OMNIPAQUE  IOHEXOL  300 MG/ML  SOLN COMPARISON:  CT neck 08/12/2023. FINDINGS: Cardiovascular: Heart is at the upper limits of normal in size. No pericardial effusion. Mediastinum/Nodes: Thoracic inlet lymph nodes are not enlarged by CT size criteria. No pathologically enlarged mediastinal, hilar or axillary lymph nodes. Esophagus is grossly unremarkable. Lungs/Pleura: No suspicious pulmonary nodules. No pleural fluid. Airway is unremarkable. Upper Abdomen: Small low-attenuation lesions in the kidneys. No  specific follow-up necessary. Left hemidiaphragm is elevated. Visualized portions of the liver, gallbladder, adrenal glands, kidneys, spleen, pancreas, stomach and bowel are otherwise grossly unremarkable. No upper abdominal adenopathy. Musculoskeletal: Degenerative changes in the spine. IMPRESSION: No evidence of metastatic disease. Electronically Signed   By: Newell Eke M.D.   On: 01/18/2024 15:01      IMPRESSION/PLAN:  This is a delightful patient with head and neck cancer. I *** recommend radiotherapy for this patient.  We discussed the potential risks, benefits, and side effects of radiotherapy. We talked in detail about acute and late effects. We discussed that some of the most bothersome acute effects may be mucositis, dysgeusia, salivary changes, skin irritation, hair loss, dehydration, weight loss and fatigue. We talked about late effects which include but are not necessarily limited to dysphagia, hypothyroidism, nerve injury, vascular injury, spinal cord injury, xerostomia, trismus, neck edema, dental issues, non-healing wound, and potentially fatal injury to any of the tissues in the head and neck region. No guarantees of treatment were given. A consent form was signed and placed in the patient's medical record. The patient is enthusiastic about proceeding with treatment. I look forward to participating in the patient's care.    Simulation (treatment planning) will take place ***  We also discussed that the treatment of head and neck cancer is a multidisciplinary process to maximize treatment outcomes and quality of life. For this reason the following referrals have been or will be made:  *** Medical oncology to discuss chemotherapy   *** Dentistry for dental evaluation, possible extractions in the radiation fields, and /or advice on reducing risk of cavities, osteoradionecrosis, or other oral issues.  *** Nutritionist for nutrition support during and after treatment.  *** Speech  language pathology for swallowing and/or speech therapy.  *** Social work for social support.   *** Physical therapy due to risk of lymphedema in neck and deconditioning.  *** Baseline labs including TSH.  On date of service, in total, I spent *** minutes on this encounter. Patient was seen in person.  __________________________________________   Lauraine Golden, MD  This document serves as a record of services personally performed by Lauraine Golden, MD. It was created on her behalf by Reymundo Cartwright, a trained medical scribe. The creation of this record is based on the scribe's personal observations and the provider's statements to them. This document has been checked and approved by the attending provider.

## 2024-01-20 NOTE — Progress Notes (Signed)
 Dental Form with Estimates of Radiation Dose  Charlie VEAR Felix Date of birth: 1950-12-22        Diagnosis:    ICD-10-CM   1. Malignant neoplasm of parotid gland (HCC)  C07     2. Cancer of parotid gland [facility])  C07       Cancer Staging  Cancer of parotid gland [facility]) Staging form: Major Salivary Glands, AJCC 8th Edition - Pathologic stage from 01/19/2024: Stage III (pT3, pN0, cM0) - Signed by Izell Domino, MD on 01/19/2024 Stage prefix: Initial diagnosis   Prognosis: curative  Anticipated # of fractions: 30 - 33   Daily?: yes  # of weeks of radiotherapy: 6 - 6.5  Chemotherapy?: no  Anticipated xerostomia:  Mild permanent   Pre-simulation needs:  Scatter protection / dental clearance and advice  Simulation: ASAP    Other Notes:   Please contact Domino Izell, MD, with patient's disposition after evaluation and/or dental treatment. -----------------------------------  Domino Izell, MD

## 2024-01-25 ENCOUNTER — Other Ambulatory Visit: Payer: Self-pay

## 2024-01-25 DIAGNOSIS — C07 Malignant neoplasm of parotid gland: Secondary | ICD-10-CM

## 2024-01-26 ENCOUNTER — Other Ambulatory Visit: Payer: Self-pay

## 2024-01-26 DIAGNOSIS — C07 Malignant neoplasm of parotid gland: Secondary | ICD-10-CM

## 2024-01-27 ENCOUNTER — Ambulatory Visit
Admission: RE | Admit: 2024-01-27 | Discharge: 2024-01-27 | Disposition: A | Source: Ambulatory Visit | Attending: Radiation Oncology

## 2024-01-27 ENCOUNTER — Ambulatory Visit
Admission: RE | Admit: 2024-01-27 | Discharge: 2024-01-27 | Disposition: A | Source: Ambulatory Visit | Attending: Radiation Oncology | Admitting: Radiation Oncology

## 2024-01-27 DIAGNOSIS — C07 Malignant neoplasm of parotid gland: Secondary | ICD-10-CM

## 2024-01-27 DIAGNOSIS — Z510 Encounter for antineoplastic radiation therapy: Secondary | ICD-10-CM | POA: Diagnosis present

## 2024-01-27 LAB — BASIC METABOLIC PANEL - CANCER CENTER ONLY
Anion gap: 8 (ref 5–15)
BUN: 17 mg/dL (ref 8–23)
CO2: 26 mmol/L (ref 22–32)
Calcium: 9.3 mg/dL (ref 8.9–10.3)
Chloride: 108 mmol/L (ref 98–111)
Creatinine: 1.05 mg/dL (ref 0.61–1.24)
GFR, Estimated: >60 mL/min
Glucose, Bld: 99 mg/dL (ref 70–99)
Potassium: 4.8 mmol/L (ref 3.5–5.1)
Sodium: 142 mmol/L (ref 135–145)

## 2024-01-27 NOTE — Progress Notes (Signed)
 Has armband been applied?  Yes.    Does patient have an allergy to IV contrast dye?: No.   Has patient ever received premedication for IV contrast dye?: No.   Date of lab work: 01/27/2024 BUN: 17 CR: 1.05 eGFR: >60  Does patient take metformin?: No.  Is eGFR >60?: Yes.   If no, when can patient resume? (Must be 48 hrs AFTER they receive IV contrast):    IV site: Right AC  Has IV site been added to flowsheet?  Yes.    There were no vitals taken for this visit.

## 2024-01-27 NOTE — Progress Notes (Signed)
 Oncology Nurse Navigator Documentation   To provide support, encouragement and care continuity, met with Mr. Eisman before his CT SIM. He was accompanied by his wife.  He tolerated procedure without difficulty, denied questions/concerns.    I encouraged him to call me prior to 02/10/24 [place].    Delon Jefferson RN, BSN, OCN Head & Neck Oncology Nurse Navigator [facility] at [facility] Phone # [phone]  Fax # [phone]

## 2024-02-02 ENCOUNTER — Other Ambulatory Visit: Payer: Self-pay

## 2024-02-02 DIAGNOSIS — C07 Malignant neoplasm of parotid gland: Secondary | ICD-10-CM

## 2024-02-08 ENCOUNTER — Ambulatory Visit: Admitting: Radiation Oncology

## 2024-02-09 ENCOUNTER — Ambulatory Visit
Admission: RE | Admit: 2024-02-09 | Discharge: 2024-02-09 | Disposition: A | Discharge status: Home / Self Care | Source: Ambulatory Visit | Attending: Radiation Oncology | Admitting: Radiation Oncology

## 2024-02-09 ENCOUNTER — Ambulatory Visit

## 2024-02-09 DIAGNOSIS — C07 Malignant neoplasm of parotid gland: Secondary | ICD-10-CM | POA: Insufficient documentation

## 2024-02-10 ENCOUNTER — Other Ambulatory Visit: Payer: Self-pay

## 2024-02-10 DIAGNOSIS — C07 Malignant neoplasm of parotid gland: Secondary | ICD-10-CM | POA: Diagnosis not present

## 2024-02-10 LAB — RAD ONC ARIA SESSION SUMMARY
Course Elapsed Days: 0
Plan Fractions Treated to Date: 1
Plan Prescribed Dose Per Fraction: 2 Gy
Plan Total Fractions Prescribed: 33
Plan Total Prescribed Dose: 66 Gy
Reference Point Dosage Given to Date: 2 Gy
Reference Point Session Dosage Given: 2 Gy
Session Number: 1

## 2024-02-11 ENCOUNTER — Ambulatory Visit: Attending: Radiation Oncology | Admitting: Physical Therapy

## 2024-02-11 ENCOUNTER — Inpatient Hospital Stay: Admitting: Dietician

## 2024-02-11 ENCOUNTER — Other Ambulatory Visit: Payer: Self-pay

## 2024-02-11 ENCOUNTER — Inpatient Hospital Stay: Attending: Radiation Oncology | Admitting: Nutrition

## 2024-02-11 ENCOUNTER — Encounter: Payer: Self-pay | Admitting: Physical Therapy

## 2024-02-11 ENCOUNTER — Ambulatory Visit
Admission: RE | Admit: 2024-02-11 | Discharge: 2024-02-11 | Disposition: A | Discharge status: Home / Self Care | Source: Ambulatory Visit | Attending: Radiation Oncology | Admitting: Radiation Oncology

## 2024-02-11 DIAGNOSIS — R293 Abnormal posture: Secondary | ICD-10-CM | POA: Insufficient documentation

## 2024-02-11 DIAGNOSIS — C07 Malignant neoplasm of parotid gland: Secondary | ICD-10-CM | POA: Diagnosis present

## 2024-02-11 DIAGNOSIS — Z483 Aftercare following surgery for neoplasm: Secondary | ICD-10-CM | POA: Insufficient documentation

## 2024-02-11 LAB — RAD ONC ARIA SESSION SUMMARY
Course Elapsed Days: 1
Plan Fractions Treated to Date: 2
Plan Prescribed Dose Per Fraction: 2 Gy
Plan Total Fractions Prescribed: 33
Plan Total Prescribed Dose: 66 Gy
Reference Point Dosage Given to Date: 4 Gy
Reference Point Session Dosage Given: 2 Gy
Session Number: 2

## 2024-02-11 NOTE — Therapy (Signed)
 " OUTPATIENT PHYSICAL THERAPY HEAD AND NECK BASELINE EVALUATION   Patient Name: Eric Cochran MRN: [id number] DOB:[birth year], 74 y.o., male Today's Date: 02/11/2024  END OF SESSION:  PT End of Session - 02/11/24 1038     Visit Number 1    Number of Visits 2    Date for Recertification  04/28/24    PT Start Time 1003    PT Stop Time 1033    PT Time Calculation (min) 30 min    Activity Tolerance Patient tolerated treatment well    Behavior During Therapy WFL for tasks assessed/performed          Past Medical History:  Diagnosis Date   Arthritis    History of kidney stones    Obesity (BMI 30-39.9)    Obstructive sleep apnea    severe- on VPAP auto    Perirectal abscess    Past Surgical History:  Procedure Laterality Date   ANKLE SURGERY Right    Achilles Tendon Rupture   BUNIONECTOMY Left    CATARACT EXTRACTION Left    CATARACT EXTRACTION W/ INTRAOCULAR LENS IMPLANT Right    x3   COLONOSCOPY     INCISION AND DRAINAGE PERIRECTAL ABSCESS  04/08/2011   Procedure: IRRIGATION AND DEBRIDEMENT PERIRECTAL ABSCESS;  Surgeon: Debby LABOR. Cornett, MD;  Location: [facility] OR;  Service: General;  Laterality: N/A;   INGUINAL HERNIA REPAIR Right    LIGAMENT REPAIR     right arm ulna   LUMBAR FUSION     x2   NASAL SINUS SURGERY     multiple   PAROTID GLAND TUMOR EXCISION Right 12/02/2023   Procedure: EXCISION, NEOPLASM, PAROTID GLAND;  Surgeon: Luciano Standing, MD;  Location: [facility] OR;  Service: ENT;  Laterality: Right;  RIGHT PAROTIDECTOMY   UMBILICAL HERNIA REPAIR     Patient Active Problem List   Diagnosis Date Noted   Cancer of parotid gland (HCC) 01/19/2024   Obstructive sleep apnea    Obesity (BMI 30-39.9)     PCP: Elfrieda Haggard, MD  REFERRING PROVIDER: Izell Domino, MD  REFERRING DIAG: C07 (ICD-10-CM) - Cancer of parotid gland [facility])  THERAPY DIAG:  Abnormal posture - Plan: PT plan of care cert/re-cert  Malignant neoplasm of parotid gland [facility]) - Plan: PT plan of care  cert/re-cert  Rationale for Evaluation and Treatment: Rehabilitation  ONSET DATE: 12/02/23  SUBJECTIVE:     SUBJECTIVE STATEMENT: Patient reports they are here today to be seen by their medical team for newly diagnosed cancer of R parotid gland.    PERTINENT HISTORY:  Cancer of the right parotid gland, stage III (T3 N0 M0). He presented to his PCP with complaints of a facial lump that had persisted for one month. 08/12/23 He underwent CT neck showing a 2.2 x 2.5 x 1.2 cm anterior parotid neoplasm on top of the masseter muscle on the very anterior aspect of the gland. No visible lymphadenopathy. Findings somewhat limited by noncontrast enhanced study. 08/18/23 He saw Dr. Luciano to discuss further treatment plan. They opted to proceed with US  guided biopsy to access lesion and if the mass was malignant they would proceed with parotidectomy. 09/07/23 He underwent the parotid biopsy revealing pleomorphic adenoma. In light of those findings he had a parotid gland neoplasm excision on 12/02/23. Surgical pathology indicated tumor size of 2.9 cm and histology of basal cell adenocarcinoma with tumor focally present at inked margin.   Per Dr Luciano it was a sticky tumor - adherent to masseter 01/14/24 CT chest  revealed no evidence of metastatic disease. 01/19/24 Consult with Dr. Izell with radiation recommended. He will receive 33 fractions of radiation to his right Parotid bed and right neck which started on 02/10/24 and complete 03/29/24. Hx of R ankle surgery, L bunionectomy, R inguinal herna repair, and lumbar fusions  PATIENT GOALS:   to be educated about the signs and symptoms of lymphedema and learn post op HEP.   PAIN:  Are you having pain? No  PRECAUTIONS: Active CA and Comment (artifical joint in toe on L foot, spinal stenosis with laminectomy and fusion, has plate in R forearm, mesh from previous hernia surgery)  RED FLAGS: None   WEIGHT BEARING RESTRICTIONS: No  FALLS:  Has patient fallen in  last 6 months? No Does the patient have a fear of falling that limits activity? 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  COGNITION: Overall cognitive status: Within functional limits for tasks assessed                  POSTURE:  Forward head and rounded shoulders posture  30 SEC SIT TO STAND: 25 reps in 30 sec without use of UEs which is  Excellent for patient's age  SHOULDER AROM:   WFL   CERVICAL AROM:   Percent limited  Flexion WFL  Extension 25% limited  Right lateral flexion 25% limited  Left lateral flexion 25% limited  Right rotation 25% limited  Left rotation 25% limited    (Blank rows=not tested)  GAIT: Assessed: Yes Assistance needed: Independent Assistive Device: none Gait pattern: WFL Ambulation surface: Level  Neck Disability Index score: 0 / 50 = 0.0 %  PATIENT EDUCATION:  Education details: Neck ROM, importance of posture when sitting, standing and lying down, deep breathing, walking program and importance of staying active throughout treatment, CURE article on staying active, Why exercise? flyer, lymphedema and PT info Person educated: Patient Education method: Explanation, Demonstration, Handout Education comprehension: Patient verbalized understanding and returned demonstration  HOME EXERCISE PROGRAM: Patient was instructed today in a home exercise program today for head and neck range of motion exercises. These included active cervical flexion, active cervical extension, active cervical rotation to each direction, upper trap stretch, and shoulder retraction. Patient was encouraged  to do these 2-3 times a day, holding for 5 sec each and completing for 5 reps. Pt was educated that once this becomes easier then hold the stretches for 30-60 seconds.    ASSESSMENT:  CLINICAL IMPRESSION: Pt arrives to PT with recently diagnosed R parotid gland cancer.He will receive 33 fractions of radiation to his right Parotid bed and right neck which started on 02/10/24 and complete 03/29/24.Pt's cervical ROM was [facility] for flexion but 25% limited in all other directions at baseline. He reports issues with neck tightness and sees a massage therapist at least once a month.  Educated pt about signs and symptoms of lymphedema as well as anatomy and physiology of lymphatic system. Educated pt in importance of staying as active as possible throughout treatment to decrease fatigue as well as head and neck ROM exercises to decrease loss of ROM. Will see pt after completion of radiation to reassess ROM  and assess for lymphedema and to determine therapy needs at that time.  Pt will benefit from skilled therapeutic intervention to improve on the following deficits: Decreased knowledge of precautions and postural dysfunction. Other deficits: decreased ROM  PT treatment/interventions: ADL/self-care home management, pt/family education, therapeutic exercise. Other interventions 97164- PT Re-evaluation, 97110-Therapeutic exercises, 97530- Therapeutic activity, [id number]- Neuromuscular re-education, 97535- Self Care, [id number]- Manual therapy, 97760- Orthotic Initial, [id number]- Orthotic/Prosthetic subsequent, and Patient/Family education  REHAB POTENTIAL: Good  CLINICAL DECISION MAKING: Evolving/moderate complexity  EVALUATION COMPLEXITY: Moderate   GOALS: Goals reviewed with patient? YES  LONG TERM GOALS: (STG=LTG)   Name Target Date  Goal status  1 Patient will be able to verbalize understanding of a home exercise program for cervical range of motion, posture, and walking.   Baseline:  No knowledge 02/11/2024  Achieved at eval  2 Patient will be able to verbalize understanding of proper sitting and standing posture. Baseline:  No knowledge 02/11/2024 Achieved at eval  3 Patient will be able to verbalize understanding of lymphedema risk and availability of treatment for this condition Baseline:  No knowledge 02/11/2024 Achieved at eval  4 Pt will demonstrate a return to full cervical ROM and function post operatively compared to baselines and not demonstrate any signs or symptoms of lymphedema.  Baseline: See objective measurements taken today. 04/28/24 New    PLAN:  PT FREQUENCY/DURATION: EVAL and 1 follow up appointment.   PLAN FOR NEXT SESSION: will reassess 2 weeks after completion of radiation to determine needs.  Patient will follow up at outpatient cancer rehab 2 weeks after completion of radiation.  If the patient requires physical therapy at that time, a specific plan will be dictated and sent to the referring physician for approval. The patient was educated today on appropriate basic range of motion exercises to begin now and continue throughout radiation and educated on the signs and symptoms of lymphedema. Patient verbalized good understanding.     Physical Therapy Information for During and After Head/Neck Cancer Treatment: Lymphedema is a swelling condition that you may be at risk for in your neck and/or face if you have radiation treatment to the area and/or if you have surgery that includes removing lymph nodes.  There is treatment available for this condition and it is not life-threatening.  Contact your physician or physical therapist with concerns. An excellent resource for those seeking information on lymphedema is the National Lymphedema Network's website.  It can be accessed at www.lymphnet.org If you notice swelling in your neck or face at any time following surgery (even if it is many years from now), please contact your doctor or physical therapist to discuss this.  Lymphedema can  be treated at any time but it is easier for you if it is treated early on. If you have had surgery to your neck, please check with your surgeon about how soon to start doing neck range of motion exercises.  If you are not having surgery, I encourage you to start doing neck range of motion exercises today and continue these while undergoing treatment, UNLESS you have irritation of your skin or soft tissue that is aggravated by doing them.  These exercises are intended to help you prevent loss of range of motion and/or to gain range of motion in your neck (which can be limited by tightening effects of radiation), and NOT to aggravate these tissues if they develop sensitivities from treatment. Neck range of motion exercises should be done to the point of feeling a GENTLE,  TOLERABLE stretch only.  You are encouraged to start a walking or other exercise program tomorrow and continue this as much as you are able through and after treatment.  Please feel free to call me with any questions. Florina Lanis Carbon, PT, CLT Physical Therapist and Certified Lymphedema Therapist [facility] [address], [address], [place], [place] [address] [phone] [email or web address]  WALKING  Walking is a great form of exercise to increase your strength, endurance and overall fitness.  A walking program can help you start slowly and gradually build endurance as you go.  Everyone's ability is different, so each person's starting point will be different.  You do not have to follow them exactly.  The are just samples. You should simply find out what's right for you and stick to that program.   In the beginning, you'll start off walking 2-3 times a day for short distances.  As you get stronger, you'll be walking further at just 1-2 times per day.  A. You Can Walk For A Certain Length Of Time Each Day    Walk 5 minutes 3 times per day.  Increase 2 minutes every 2 days (3 times per  day).  Work up to 25-30 minutes (1-2 times per day).   Example:   Day 1-2 5 minutes 3 times per day   Day 7-8 12 minutes 2-3 times per day   Day 13-14 25 minutes 1-2 times per day  B. You Can Walk For a Certain Distance Each Day     Distance can be substituted for time.    Example:   3 trips to mailbox (at road)   3 trips to corner of block   3 trips around the block  C. Go to local high school and use the track.    Walk for distance ____ around track  Or time ____ minutes  D. Walk ____ Jog ____ Run ___   Why exercise?  So many benefits! Here are SOME of them: Heart health, including raising your good cholesterol level and reducing heart rate and blood pressure Lung health, including improved lung capacity It burns fats, and most of us  can stand to be leaner, whether or not we are overweight. It increases the body's natural painkillers and mood elevators, so makes you feel better. Not only makes you feel better, but look better too Improves sleep Takes a bite out of stress May decrease your risk of many types of cancer If you are currently undergoing cancer treatment, exercise may improve your ability to tolerate treatments including chemotherapy. For everybody, it can improve your energy level. Those with cancer-related fatigue report a 40-50% reduction in this symptom when exercising regularly. If you are a survivor of breast, colon, or prostate cancer, it may decrease your risk of a recurrence. (This may hold for other cancers too, but so far we have data just for these three types.)  How to exercise: Get your doctor's okay. Pick something you enjoy doing, like walking, Zumba, biking, swimming, or whatever. Start at low intensity and time, then gradually increase.  (See walking program handout.) Set a goal to achieve over time.  The American Cancer Society, American Heart Association, and U.S. Dept. of Health and Human Services recommend 150 minutes of moderate  exercise, 75 minutes of vigorous exercise, or a combination of both per week. This should be done in episodes at least 10 minutes long, spread throughout the week.  Need help being motivated? Pick something you enjoy doing, because  you'll be more inclined to stick with that activity than something that feels like a chore. Do it with a friend so that you are accountable to each other. Schedule it into your day. Place it on your calendar and keep that appointment just like you do any appointment that you make. Join an exercise group that meets at a specific time.  That way, you have to show up on time, and that makes it harder to procrastinate about doing your workout.  It also keeps you accountable--people begin to expect you to be there. Join a gym where you feel comfortable and not intimidated, at the right cost. Sign up for something that you'll need to be in shape for on a specific date, like a 1K or a 5K to walk or run, a 20 or 30 mile bike ride, a mud run or something like that. If the date is looming, you know you'll need to train to be ready for it.  An added benefit is that many of these are fundraisers for good causes. If you've already paid for a gym membership, group exercise class or event, you might as well work out, so you haven't wasted your money!    [facility], PT 02/11/2024, 10:45 AM                       "

## 2024-02-11 NOTE — Progress Notes (Signed)
 74 year old male diagnosed with parotid gland cancer receiving radiation therapy and under the care of Dr. Izell.  Patient has a history of basal cell carcinoma and has undergone 2 Mohs surgery. Final radiation therapy is scheduled for February 24  Past medical history includes kidney stones, OSA, and pararectal abscess  Medications include multivitamin  Labs on December 24 were reviewed.  Height: 5 feet 8 inches. Weight: 193 pounds December 24 195 pounds December 16 196.8 pounds October 23  BMI: 29.35  Spoke with patient and wife for nutrition appointment.  Patient has noticed that his mouth has been dry since his surgery.  Reports lactose intolerance for many years and has always avoided all products with whey protein except for some aged cheeses.  He likes chicken and fish.  He eats beef occasionally.  He does not enjoy beans.  He will eat eggs.  His usual eating pattern includes 1 larger meal daily with some smaller meals at other times.  Patient is very pleased with holistic care provided throughout the cancer center.  Estimated nutrition needs: 1900-2100 cal, 115-130 g protein, greater than 2 L fluid.  Nutrition diagnosis: Food and nutrition related knowledge deficit related to cancer and associated treatments as evidenced by no prior need for nutrition related information.  Intervention: Educated on importance of smaller more frequent meals and snacks with adequate calories and protein to minimize weight loss. Reviewed importance of high-protein foods. Discussed nondairy shakes and protein supplements. Educated on strategies to improve dry mouth. Questions were answered. Provided nutrition fact sheets with contact information.  Monitoring, evaluation, goals: Tolerate adequate calories and protein to minimize weight loss and promote healing.  Next visit scheduled by telephone on Tuesday, January 13 with Barb.  **Disclaimer: This note was dictated with voice recognition  software. Similar sounding words can inadvertently be transcribed and this note may contain transcription errors which may not have been corrected upon publication of note.**

## 2024-02-12 ENCOUNTER — Ambulatory Visit
Admission: RE | Admit: 2024-02-12 | Discharge: 2024-02-12 | Disposition: A | Source: Ambulatory Visit | Attending: Radiation Oncology

## 2024-02-12 ENCOUNTER — Other Ambulatory Visit: Payer: Self-pay

## 2024-02-12 DIAGNOSIS — C07 Malignant neoplasm of parotid gland: Secondary | ICD-10-CM | POA: Diagnosis not present

## 2024-02-12 LAB — RAD ONC ARIA SESSION SUMMARY
Course Elapsed Days: 2
Plan Fractions Treated to Date: 3
Plan Prescribed Dose Per Fraction: 2 Gy
Plan Total Fractions Prescribed: 33
Plan Total Prescribed Dose: 66 Gy
Reference Point Dosage Given to Date: 6 Gy
Reference Point Session Dosage Given: 2 Gy
Session Number: 3

## 2024-02-15 ENCOUNTER — Ambulatory Visit: Admission: RE | Admit: 2024-02-15 | Discharge: 2024-02-15 | Attending: Radiation Oncology

## 2024-02-15 ENCOUNTER — Other Ambulatory Visit: Payer: Self-pay

## 2024-02-15 ENCOUNTER — Ambulatory Visit
Admission: RE | Admit: 2024-02-15 | Discharge: 2024-02-15 | Disposition: A | Source: Ambulatory Visit | Attending: Radiation Oncology

## 2024-02-15 DIAGNOSIS — C07 Malignant neoplasm of parotid gland: Secondary | ICD-10-CM

## 2024-02-15 LAB — RAD ONC ARIA SESSION SUMMARY
Course Elapsed Days: 5
Plan Fractions Treated to Date: 4
Plan Prescribed Dose Per Fraction: 2 Gy
Plan Total Fractions Prescribed: 33
Plan Total Prescribed Dose: 66 Gy
Reference Point Dosage Given to Date: 8 Gy
Reference Point Session Dosage Given: 2 Gy
Session Number: 4

## 2024-02-15 MED ORDER — SONAFINE EX EMUL
1.0000 | Freq: Once | CUTANEOUS | Status: AC
  Administered 2024-02-15: 1 via TOPICAL

## 2024-02-16 ENCOUNTER — Inpatient Hospital Stay: Admitting: Nutrition

## 2024-02-16 ENCOUNTER — Ambulatory Visit
Admission: RE | Admit: 2024-02-16 | Discharge: 2024-02-16 | Disposition: A | Source: Ambulatory Visit | Attending: Radiation Oncology

## 2024-02-16 ENCOUNTER — Other Ambulatory Visit: Payer: Self-pay

## 2024-02-16 DIAGNOSIS — C07 Malignant neoplasm of parotid gland: Secondary | ICD-10-CM | POA: Diagnosis not present

## 2024-02-16 LAB — RAD ONC ARIA SESSION SUMMARY
Course Elapsed Days: 6
Plan Fractions Treated to Date: 5
Plan Prescribed Dose Per Fraction: 2 Gy
Plan Total Fractions Prescribed: 33
Plan Total Prescribed Dose: 66 Gy
Reference Point Dosage Given to Date: 10 Gy
Reference Point Session Dosage Given: 2 Gy
Session Number: 5

## 2024-02-16 NOTE — Progress Notes (Signed)
 Contacted patient by phone for nutrition follow-up.  Patient receiving radiation therapy for parotid gland cancer and followed by Dr. Izell.  Final radiation therapy is scheduled for February 24  Weight: 196.2 pounds January 12 193 pounds December 24 195 pounds December 16 196.8 pounds October 23  Patient reports he is doing well.  His wife is forcing him to eat therefore he has gained a few pounds.  Reports he is chewing sugar-free gum for his dry mouth and that has worked well.  He continues to drink a lot of water and is eating normally.  He received samples of supplements but has not tried them yet.  They have a friend who provided recipes for protein smoothies which he is going to try before he tries commercial ONS.  No questions or concerns today.  Estimated nutrition needs:  1900-2100 cal, 115-130 g protein, greater than 2 L fluid.  Nutrition diagnosis:  Food and nutrition related knowledge deficit, improved.  Intervention: Continue strategies for adequate calorie and protein intake. Encouraged smoothies or oral nutrition supplements as needed. Continue strategies for improving dry mouth and thick saliva.  Monitoring, evaluation, goals: Patient will tolerate adequate calories and protein to minimize weight loss.  Next visit:  Tuesday, January 20 after radiation therapy with Elvie.

## 2024-02-17 ENCOUNTER — Other Ambulatory Visit: Payer: Self-pay

## 2024-02-17 ENCOUNTER — Ambulatory Visit
Admission: RE | Admit: 2024-02-17 | Discharge: 2024-02-17 | Disposition: A | Discharge status: Home / Self Care | Source: Ambulatory Visit | Attending: Radiation Oncology | Admitting: Radiation Oncology

## 2024-02-17 DIAGNOSIS — C07 Malignant neoplasm of parotid gland: Secondary | ICD-10-CM | POA: Diagnosis not present

## 2024-02-17 LAB — RAD ONC ARIA SESSION SUMMARY
Course Elapsed Days: 7
Plan Fractions Treated to Date: 6
Plan Prescribed Dose Per Fraction: 2 Gy
Plan Total Fractions Prescribed: 33
Plan Total Prescribed Dose: 66 Gy
Reference Point Dosage Given to Date: 12 Gy
Reference Point Session Dosage Given: 2 Gy
Session Number: 6

## 2024-02-18 ENCOUNTER — Ambulatory Visit
Admission: RE | Admit: 2024-02-18 | Discharge: 2024-02-18 | Disposition: A | Source: Ambulatory Visit | Attending: Radiation Oncology

## 2024-02-18 ENCOUNTER — Other Ambulatory Visit: Payer: Self-pay

## 2024-02-18 DIAGNOSIS — C07 Malignant neoplasm of parotid gland: Secondary | ICD-10-CM | POA: Diagnosis not present

## 2024-02-18 LAB — RAD ONC ARIA SESSION SUMMARY
Course Elapsed Days: 8
Plan Fractions Treated to Date: 7
Plan Prescribed Dose Per Fraction: 2 Gy
Plan Total Fractions Prescribed: 33
Plan Total Prescribed Dose: 66 Gy
Reference Point Dosage Given to Date: 14 Gy
Reference Point Session Dosage Given: 2 Gy
Session Number: 7

## 2024-02-19 ENCOUNTER — Ambulatory Visit

## 2024-02-22 ENCOUNTER — Ambulatory Visit

## 2024-02-22 ENCOUNTER — Ambulatory Visit
Admission: RE | Admit: 2024-02-22 | Discharge: 2024-02-22 | Disposition: A | Source: Ambulatory Visit | Attending: Radiation Oncology

## 2024-02-22 ENCOUNTER — Other Ambulatory Visit: Payer: Self-pay

## 2024-02-22 DIAGNOSIS — C07 Malignant neoplasm of parotid gland: Secondary | ICD-10-CM | POA: Diagnosis not present

## 2024-02-22 LAB — RAD ONC ARIA SESSION SUMMARY
Course Elapsed Days: 12
Plan Fractions Treated to Date: 8
Plan Prescribed Dose Per Fraction: 2 Gy
Plan Total Fractions Prescribed: 33
Plan Total Prescribed Dose: 66 Gy
Reference Point Dosage Given to Date: 16 Gy
Reference Point Session Dosage Given: 2 Gy
Session Number: 8

## 2024-02-23 ENCOUNTER — Other Ambulatory Visit: Payer: Self-pay

## 2024-02-23 ENCOUNTER — Ambulatory Visit
Admission: RE | Admit: 2024-02-23 | Discharge: 2024-02-23 | Disposition: A | Source: Ambulatory Visit | Attending: Radiation Oncology

## 2024-02-23 ENCOUNTER — Inpatient Hospital Stay: Admitting: Dietician

## 2024-02-23 DIAGNOSIS — C07 Malignant neoplasm of parotid gland: Secondary | ICD-10-CM | POA: Diagnosis not present

## 2024-02-23 LAB — RAD ONC ARIA SESSION SUMMARY
Course Elapsed Days: 13
Plan Fractions Treated to Date: 9
Plan Prescribed Dose Per Fraction: 2 Gy
Plan Total Fractions Prescribed: 33
Plan Total Prescribed Dose: 66 Gy
Reference Point Dosage Given to Date: 18 Gy
Reference Point Session Dosage Given: 2 Gy
Session Number: 9

## 2024-02-23 NOTE — Progress Notes (Signed)
 Nutrition Follow-up:  Patient with cancer of parotid gland. He is receiving radiation therapy. Final treatment planned 2/24 under the care of Dr. Izell.   Met with patient and wife in office following radiation. Patient reports tolerating treatment well other than dry mouth. He has not been doing baking soda salt water gargles. Patient asking about benefits of doing this. Appetite remains good. Tolerating regular diet and eating as usual. Ate chicken thigh with ketchup for breakfast. Patient has not tried ONS samples. He is drinking more water than ever.    Medications: reviewed   Labs: no new labs   Anthropometrics: No new wt this week. Pt 196.2 lb on 1/12  12/24 - 193 lb 12/16 - 195 lb   Estimated Energy Needs  Kcals: 1900-2100 Protein: 115-130 Fluid: >2L  NUTRITION DIAGNOSIS: Food and nutrition related knowledge deficit improved    INTERVENTION:  Educated on rationale for baking soda salt water gargles, recommend using at least 3 times/day  Continue regular diet at this time, encourage high calorie high protein foods to support wt maintenance     MONITORING, EVALUATION, GOAL: wt trends, intake   NEXT VISIT: Tuesday January 27 via telephone with Heron (pt and wife aware)

## 2024-02-24 ENCOUNTER — Ambulatory Visit
Admission: RE | Admit: 2024-02-24 | Discharge: 2024-02-24 | Disposition: A | Source: Ambulatory Visit | Attending: Radiation Oncology

## 2024-02-24 ENCOUNTER — Other Ambulatory Visit: Payer: Self-pay

## 2024-02-24 DIAGNOSIS — C07 Malignant neoplasm of parotid gland: Secondary | ICD-10-CM | POA: Diagnosis not present

## 2024-02-24 LAB — RAD ONC ARIA SESSION SUMMARY
Course Elapsed Days: 14
Plan Fractions Treated to Date: 10
Plan Prescribed Dose Per Fraction: 2 Gy
Plan Total Fractions Prescribed: 33
Plan Total Prescribed Dose: 66 Gy
Reference Point Dosage Given to Date: 20 Gy
Reference Point Session Dosage Given: 2 Gy
Session Number: 10

## 2024-02-25 ENCOUNTER — Other Ambulatory Visit: Payer: Self-pay

## 2024-02-25 ENCOUNTER — Ambulatory Visit
Admission: RE | Admit: 2024-02-25 | Discharge: 2024-02-25 | Disposition: A | Discharge status: Home / Self Care | Source: Ambulatory Visit | Attending: Radiation Oncology | Admitting: Radiation Oncology

## 2024-02-25 DIAGNOSIS — C07 Malignant neoplasm of parotid gland: Secondary | ICD-10-CM | POA: Diagnosis not present

## 2024-02-25 LAB — RAD ONC ARIA SESSION SUMMARY
Course Elapsed Days: 15
Plan Fractions Treated to Date: 11
Plan Prescribed Dose Per Fraction: 2 Gy
Plan Total Fractions Prescribed: 33
Plan Total Prescribed Dose: 66 Gy
Reference Point Dosage Given to Date: 22 Gy
Reference Point Session Dosage Given: 2 Gy
Session Number: 11

## 2024-02-26 ENCOUNTER — Ambulatory Visit
Admission: RE | Admit: 2024-02-26 | Discharge: 2024-02-26 | Disposition: A | Source: Ambulatory Visit | Attending: Radiation Oncology

## 2024-02-26 ENCOUNTER — Other Ambulatory Visit: Payer: Self-pay

## 2024-02-26 DIAGNOSIS — C07 Malignant neoplasm of parotid gland: Secondary | ICD-10-CM | POA: Diagnosis not present

## 2024-02-26 LAB — RAD ONC ARIA SESSION SUMMARY
Course Elapsed Days: 16
Plan Fractions Treated to Date: 12
Plan Prescribed Dose Per Fraction: 2 Gy
Plan Total Fractions Prescribed: 33
Plan Total Prescribed Dose: 66 Gy
Reference Point Dosage Given to Date: 24 Gy
Reference Point Session Dosage Given: 2 Gy
Session Number: 12

## 2024-02-29 ENCOUNTER — Ambulatory Visit

## 2024-03-01 ENCOUNTER — Ambulatory Visit

## 2024-03-01 ENCOUNTER — Ambulatory Visit
Admission: RE | Admit: 2024-03-01 | Discharge: 2024-03-01 | Disposition: A | Source: Ambulatory Visit | Attending: Radiation Oncology

## 2024-03-01 ENCOUNTER — Other Ambulatory Visit: Payer: Self-pay

## 2024-03-01 ENCOUNTER — Inpatient Hospital Stay: Admitting: Nutrition

## 2024-03-01 LAB — RAD ONC ARIA SESSION SUMMARY
Course Elapsed Days: 20
Plan Fractions Treated to Date: 13
Plan Prescribed Dose Per Fraction: 2 Gy
Plan Total Fractions Prescribed: 33
Plan Total Prescribed Dose: 66 Gy
Reference Point Dosage Given to Date: 26 Gy
Reference Point Session Dosage Given: 2 Gy
Session Number: 13

## 2024-03-01 NOTE — Progress Notes (Signed)
 Telephone follow-up completed with patient receiving radiation therapy for cancer of parotid gland.  Final  treatment is scheduled for February 25.  He is followed by Dr. Izell.  Last weight 196.2 pounds, documented on January 12.  Reports weight is stable on home scale and varies between 193 to 197 pounds.  He is beginning to struggle a bit trying to find foods that taste good.  Reports his wife watches him like a hawk and prepares foods that are soft and easy for him to consume.  Reports he ate stir fry for breakfast.  His dry mouth seems to be improved with strategies provided.  He is using baking soda and salt water gargles and using sugar-free candies and gum.  He has also tried Biotene and states it helps him as well.  He has no questions or concerns today.  Estimated nutrition needs: 1900-2100 cal, 115-130 g protein, greater than 2 L fluid.  Nutrition diagnosis: Food and nutrition related knowledge deficit, improved.  Intervention: Continue strategies for adequate calorie and protein intake. Continue to monitor her weight at home and use oral nutrition supplements as needed. Continue strategies for improving dry mouth and taste alterations.  Monitoring, evaluation, goals: Patient will tolerate adequate calories and protein to minimize weight loss.  Next visit: Tuesday, February 3.  **Disclaimer: This note was dictated with voice recognition software. Similar sounding words can inadvertently be transcribed and this note may contain transcription errors which may not have been corrected upon publication of note.**

## 2024-03-02 ENCOUNTER — Ambulatory Visit
Admission: RE | Admit: 2024-03-02 | Discharge: 2024-03-02 | Disposition: A | Source: Ambulatory Visit | Attending: Radiation Oncology

## 2024-03-02 ENCOUNTER — Ambulatory Visit

## 2024-03-02 ENCOUNTER — Other Ambulatory Visit: Payer: Self-pay

## 2024-03-02 LAB — RAD ONC ARIA SESSION SUMMARY
Course Elapsed Days: 21
Plan Fractions Treated to Date: 14
Plan Prescribed Dose Per Fraction: 2 Gy
Plan Total Fractions Prescribed: 33
Plan Total Prescribed Dose: 66 Gy
Reference Point Dosage Given to Date: 28 Gy
Reference Point Session Dosage Given: 2 Gy
Session Number: 14

## 2024-03-03 ENCOUNTER — Other Ambulatory Visit: Payer: Self-pay

## 2024-03-03 ENCOUNTER — Ambulatory Visit
Admission: RE | Admit: 2024-03-03 | Discharge: 2024-03-03 | Disposition: A | Discharge status: Home / Self Care | Source: Ambulatory Visit | Attending: Radiation Oncology | Admitting: Radiation Oncology

## 2024-03-03 LAB — RAD ONC ARIA SESSION SUMMARY
Course Elapsed Days: 22
Plan Fractions Treated to Date: 15
Plan Prescribed Dose Per Fraction: 2 Gy
Plan Total Fractions Prescribed: 33
Plan Total Prescribed Dose: 66 Gy
Reference Point Dosage Given to Date: 30 Gy
Reference Point Session Dosage Given: 2 Gy
Session Number: 15

## 2024-03-04 ENCOUNTER — Ambulatory Visit

## 2024-03-07 ENCOUNTER — Ambulatory Visit

## 2024-03-08 ENCOUNTER — Ambulatory Visit: Admission: RE | Admit: 2024-03-08 | Discharge: 2024-03-08 | Attending: Radiation Oncology

## 2024-03-08 ENCOUNTER — Other Ambulatory Visit: Payer: Self-pay

## 2024-03-08 ENCOUNTER — Inpatient Hospital Stay: Admitting: Dietician

## 2024-03-08 ENCOUNTER — Ambulatory Visit
Admission: RE | Admit: 2024-03-08 | Discharge: 2024-03-08 | Disposition: A | Source: Ambulatory Visit | Attending: Radiation Oncology

## 2024-03-08 ENCOUNTER — Telehealth: Payer: Self-pay | Admitting: Dietician

## 2024-03-08 LAB — RAD ONC ARIA SESSION SUMMARY
Course Elapsed Days: 27
Plan Fractions Treated to Date: 16
Plan Prescribed Dose Per Fraction: 2 Gy
Plan Total Fractions Prescribed: 33
Plan Total Prescribed Dose: 66 Gy
Reference Point Dosage Given to Date: 32 Gy
Reference Point Session Dosage Given: 2 Gy
Session Number: 16

## 2024-03-08 NOTE — Telephone Encounter (Signed)
 Nutrition Follow-up:  Patient with SCC of right parotid. He is receiving radiation therapy under the care of Dr. Izell. Final therapy planned 2/25  Spoke with patient and wife via speaker phone. Patient reports doing wonderful. Appetite remains good. He is tolerating regular diet without difficulty. Had a bowl of home made chicken soup for breakfast. Recalls grilled chicken, broccoli, green beans for dinner. Patient reports worsening dry mouth. Chewing gum has been very helpful. Patient is doing baking soda salt water gargles couple times daily. He is drinking plenty of water. Says he goes to the bathroom 8-9 times/day. He denies nausea, vomiting, diarrhea, constipation.   Medications: reviewed   Labs: no new labs   Anthropometrics: Last wt 193 lb (2/3 - aria)   1/27 - 194 lb  1/12 - 196.2 lb   Estimated Energy Needs  Kcals: 1900-2100 Protein: 115-130 Fluid: >2L  NUTRITION DIAGNOSIS: Food and nutrition related knowledge deficit improved    INTERVENTION:  Continue strategies for adequate calorie/protein energy intake Encourage use of ONS as needed  Continue baking soda salt water gargles, suggested trying before po  Continue strategies for dry mouth     MONITORING, EVALUATION, GOAL: wt trends, intake   NEXT VISIT: Tuesday February 10 via telephone with Heron

## 2024-03-09 ENCOUNTER — Ambulatory Visit
Admission: RE | Admit: 2024-03-09 | Discharge: 2024-03-09 | Disposition: A | Source: Ambulatory Visit | Attending: Radiation Oncology

## 2024-03-09 ENCOUNTER — Other Ambulatory Visit: Payer: Self-pay

## 2024-03-09 LAB — RAD ONC ARIA SESSION SUMMARY
Course Elapsed Days: 28
Plan Fractions Treated to Date: 17
Plan Prescribed Dose Per Fraction: 2 Gy
Plan Total Fractions Prescribed: 33
Plan Total Prescribed Dose: 66 Gy
Reference Point Dosage Given to Date: 34 Gy
Reference Point Session Dosage Given: 2 Gy
Session Number: 17

## 2024-03-10 ENCOUNTER — Other Ambulatory Visit: Payer: Self-pay

## 2024-03-10 ENCOUNTER — Ambulatory Visit
Admission: RE | Admit: 2024-03-10 | Discharge: 2024-03-10 | Disposition: A | Source: Ambulatory Visit | Attending: Radiation Oncology

## 2024-03-10 LAB — RAD ONC ARIA SESSION SUMMARY
Course Elapsed Days: 29
Plan Fractions Treated to Date: 18
Plan Prescribed Dose Per Fraction: 2 Gy
Plan Total Fractions Prescribed: 33
Plan Total Prescribed Dose: 66 Gy
Reference Point Dosage Given to Date: 36 Gy
Reference Point Session Dosage Given: 2 Gy
Session Number: 18

## 2024-03-11 ENCOUNTER — Other Ambulatory Visit: Payer: Self-pay

## 2024-03-11 ENCOUNTER — Ambulatory Visit: Admission: RE | Admit: 2024-03-11 | Source: Ambulatory Visit

## 2024-03-11 LAB — RAD ONC ARIA SESSION SUMMARY
Course Elapsed Days: 30
Plan Fractions Treated to Date: 19
Plan Prescribed Dose Per Fraction: 2 Gy
Plan Total Fractions Prescribed: 33
Plan Total Prescribed Dose: 66 Gy
Reference Point Dosage Given to Date: 38 Gy
Reference Point Session Dosage Given: 2 Gy
Session Number: 19

## 2024-03-14 ENCOUNTER — Ambulatory Visit

## 2024-03-15 ENCOUNTER — Inpatient Hospital Stay: Admitting: Nutrition

## 2024-03-15 ENCOUNTER — Ambulatory Visit

## 2024-03-16 ENCOUNTER — Ambulatory Visit

## 2024-03-17 ENCOUNTER — Ambulatory Visit

## 2024-03-18 ENCOUNTER — Ambulatory Visit

## 2024-03-21 ENCOUNTER — Ambulatory Visit

## 2024-03-22 ENCOUNTER — Inpatient Hospital Stay: Admitting: Dietician

## 2024-03-22 ENCOUNTER — Ambulatory Visit

## 2024-03-23 ENCOUNTER — Ambulatory Visit

## 2024-03-24 ENCOUNTER — Ambulatory Visit

## 2024-03-25 ENCOUNTER — Ambulatory Visit

## 2024-03-28 ENCOUNTER — Ambulatory Visit

## 2024-03-29 ENCOUNTER — Ambulatory Visit

## 2024-03-29 ENCOUNTER — Inpatient Hospital Stay: Admitting: Nutrition

## 2024-03-30 ENCOUNTER — Ambulatory Visit

## 2024-03-31 ENCOUNTER — Ambulatory Visit

## 2024-04-19 ENCOUNTER — Ambulatory Visit: Admitting: Physical Therapy

## 2024-07-13 ENCOUNTER — Ambulatory Visit: Admitting: Cardiology
